# Patient Record
Sex: Female | Born: 1940 | Race: White | Hispanic: No | Smoking: Current every day smoker
Health system: Southern US, Community
[De-identification: ages and names within clinical notes are randomized; demographics above are authoritative.]

## PROBLEM LIST (undated history)

## (undated) DIAGNOSIS — E785 Hyperlipidemia, unspecified: Secondary | ICD-10-CM

## (undated) DIAGNOSIS — F329 Major depressive disorder, single episode, unspecified: Secondary | ICD-10-CM

## (undated) DIAGNOSIS — L409 Psoriasis, unspecified: Secondary | ICD-10-CM

## (undated) DIAGNOSIS — E119 Type 2 diabetes mellitus without complications: Secondary | ICD-10-CM

## (undated) DIAGNOSIS — F32A Depression, unspecified: Secondary | ICD-10-CM

## (undated) HISTORY — PX: HEMORROIDECTOMY: SUR656

## (undated) HISTORY — PX: CAROTID ARTERY ANGIOPLASTY: SHX1300

## (undated) HISTORY — PX: ABDOMINAL HYSTERECTOMY: SHX81

---

## 2019-01-15 ENCOUNTER — Encounter (HOSPITAL_BASED_OUTPATIENT_CLINIC_OR_DEPARTMENT_OTHER): Payer: Self-pay

## 2019-01-15 ENCOUNTER — Emergency Department (HOSPITAL_BASED_OUTPATIENT_CLINIC_OR_DEPARTMENT_OTHER)
Admission: EM | Admit: 2019-01-15 | Discharge: 2019-01-15 | Disposition: A | Discharge status: Home / Self Care | Payer: Medicare Other | Attending: Emergency Medicine | Admitting: Emergency Medicine

## 2019-01-15 ENCOUNTER — Other Ambulatory Visit: Payer: Self-pay

## 2019-01-15 DIAGNOSIS — Z7984 Long term (current) use of oral hypoglycemic drugs: Secondary | ICD-10-CM | POA: Insufficient documentation

## 2019-01-15 DIAGNOSIS — Y999 Unspecified external cause status: Secondary | ICD-10-CM | POA: Insufficient documentation

## 2019-01-15 DIAGNOSIS — Y929 Unspecified place or not applicable: Secondary | ICD-10-CM | POA: Insufficient documentation

## 2019-01-15 DIAGNOSIS — Y9301 Activity, walking, marching and hiking: Secondary | ICD-10-CM | POA: Insufficient documentation

## 2019-01-15 DIAGNOSIS — T148XXA Other injury of unspecified body region, initial encounter: Secondary | ICD-10-CM

## 2019-01-15 DIAGNOSIS — W19XXXA Unspecified fall, initial encounter: Secondary | ICD-10-CM

## 2019-01-15 DIAGNOSIS — Z791 Long term (current) use of non-steroidal anti-inflammatories (NSAID): Secondary | ICD-10-CM | POA: Diagnosis not present

## 2019-01-15 DIAGNOSIS — F17210 Nicotine dependence, cigarettes, uncomplicated: Secondary | ICD-10-CM | POA: Diagnosis not present

## 2019-01-15 DIAGNOSIS — E119 Type 2 diabetes mellitus without complications: Secondary | ICD-10-CM | POA: Diagnosis not present

## 2019-01-15 DIAGNOSIS — Z79899 Other long term (current) drug therapy: Secondary | ICD-10-CM | POA: Diagnosis not present

## 2019-01-15 DIAGNOSIS — W109XXA Fall (on) (from) unspecified stairs and steps, initial encounter: Secondary | ICD-10-CM | POA: Insufficient documentation

## 2019-01-15 DIAGNOSIS — S51812A Laceration without foreign body of left forearm, initial encounter: Secondary | ICD-10-CM | POA: Diagnosis not present

## 2019-01-15 DIAGNOSIS — Z23 Encounter for immunization: Secondary | ICD-10-CM | POA: Diagnosis not present

## 2019-01-15 DIAGNOSIS — S59912A Unspecified injury of left forearm, initial encounter: Secondary | ICD-10-CM | POA: Diagnosis present

## 2019-01-15 HISTORY — DX: Hyperlipidemia, unspecified: E78.5

## 2019-01-15 HISTORY — DX: Psoriasis, unspecified: L40.9

## 2019-01-15 HISTORY — DX: Depression, unspecified: F32.A

## 2019-01-15 HISTORY — DX: Type 2 diabetes mellitus without complications: E11.9

## 2019-01-15 HISTORY — DX: Major depressive disorder, single episode, unspecified: F32.9

## 2019-01-15 MED ORDER — TETANUS-DIPHTH-ACELL PERTUSSIS 5-2.5-18.5 LF-MCG/0.5 IM SUSP
0.5000 mL | Freq: Once | INTRAMUSCULAR | Status: AC
  Administered 2019-01-15: 0.5 mL via INTRAMUSCULAR
  Filled 2019-01-15: qty 0.5

## 2019-01-15 MED ORDER — PREDNISONE 20 MG PO TABS: 40.0000 mg | ORAL_TABLET | Freq: Every day | ORAL | 0 refills | Status: DC

## 2019-01-15 NOTE — ED Provider Notes (Signed)
[facility] EMERGENCY DEPARTMENT Provider Note   CSN: [id number] Arrival date & time: 01/15/19  1850    History   Chief Complaint Chief Complaint  Patient presents with  . Fall    HPI Jillian Webb is a 78 y.o. female.   HPI   77yF presenting after fall. Tripped going up steps. Didn't hit head. Denis acute headache, neck or back pain.  She has skin tears to her left forearm and left lower extremity.  Hematoma underneath the skin tears in her left forearm.  She is not anticoagulated.  She is unsure of her last tetanus shot.  Past Medical History:  Diagnosis Date  . Depression   . Diabetes mellitus without complication (HCC)   . Hyperlipidemia   . Psoriasis     There are no active problems to display for this patient.   Past Surgical History:  Procedure Laterality Date  . ABDOMINAL HYSTERECTOMY    . CAROTID ARTERY ANGIOPLASTY    . HEMORROIDECTOMY       OB History   No obstetric history on file.      Home Medications    Prior to Admission medications   Medication Sig Start Date End Date Taking? Authorizing Provider  gabapentin (NEURONTIN) 100 MG capsule TAKE 3 CAPSULES BY MOUTH EVERY NIGHT AT BEDTIME 05/18/18  Yes [provider]  glipiZIDE (GLUCOTROL) 5 MG tablet Take by mouth. 01/03/19  Yes [provider]  metFORMIN (GLUCOPHAGE) 1000 MG tablet Take 1,000 mg by mouth 2 (two) times daily with a meal.   Yes [provider]  meloxicam (MOBIC) 7.5 MG tablet Take by mouth.    [provider]  pravastatin (PRAVACHOL) 20 MG tablet Take by mouth.    [provider]  predniSONE (DELTASONE) 20 MG tablet Take 2 tablets (40 mg total) by mouth daily. 01/15/19   Raeford Razor, MD  sertraline (ZOLOFT) 25 MG tablet Take by mouth.    [provider]  Vitamin D, Ergocalciferol, (DRISDOL) 1.25 MG (50000 UT) CAPS capsule Take by mouth.    [provider]    Family History No family history on file.   Social History Social History   Tobacco Use  . Smoking status: Current Every Day Smoker    Types: Cigarettes  . Smokeless tobacco: Never Used  Substance Use Topics  . Alcohol use: Never    Frequency: Never  . Drug use: Never    Allergies   Sulfa antibiotics and Losartan   Review of Systems Review of Systems  All systems reviewed and negative, other than as noted in HPI.  Physical Exam Updated Vital Signs BP (!) 174/86 (BP Location: Right Arm)   Pulse 95   Temp 98.3 F (36.8 C) (Oral)   Resp 16   Ht 5' (1.524 m)   Wt 39.5 kg   SpO2 98%   BMI 16.99 kg/m   Physical Exam Vitals signs and nursing note reviewed.  Constitutional:      General: She is not in acute distress.    Appearance: She is well-developed.  HENT:     Head: Normocephalic and atraumatic.  Eyes:     General:        Right eye: No discharge.        Left eye: No discharge.     Conjunctiva/sclera: Conjunctivae normal.  Neck:     Musculoskeletal: Neck supple.  Cardiovascular:     Rate and Rhythm: Normal rate and regular rhythm.     Heart sounds:  Normal heart sounds. No murmur. No friction rub. No gallop.   Pulmonary:     Effort: Pulmonary effort is normal. No respiratory distress.     Breath sounds: Normal breath sounds.  Abdominal:     General: There is no distension.     Palpations: Abdomen is soft.     Tenderness: There is no abdominal tenderness.  Musculoskeletal:        General: No tenderness.     Comments: Skin tears to mid L forearm and L shin. Large hematoma underlying skin tear on forearm. No significant bony tenderness. No midline spinal tenderness. Nvi.   Skin:    General: Skin is warm and dry.  Neurological:     Mental Status: She is alert.  Psychiatric:        Behavior: Behavior normal.        Thought Content: Thought content normal.      ED Treatments / Results  Labs (all labs ordered are listed, but only abnormal results are displayed) Labs Reviewed - No data to display   EKG None  Radiology No results found.  Procedures Procedures (including critical care time)  LACERATION REPAIR Performed by: Raeford RazorStephen Jeneen Doutt Authorized by: Raeford RazorStephen Blessing Ozga Consent: Verbal consent obtained. Risks and benefits: risks, benefits and alternatives were discussed Consent given by: patient Patient identity confirmed: provided demographic data Prepped and Draped in normal sterile fashion Wound explored  Laceration Location: L forearm  Laceration Length: 3 cm  No Foreign Bodies seen or palpated  Anesthesia: local infiltration  Local anesthetic: none  Anesthetic total: n/a Irrigation method: syringe Amount of cleaning: standard  Skin closure: steri strips  Patient tolerance: Patient tolerated the procedure well with no immediate complications.  LACERATION REPAIR Performed by: Raeford RazorStephen Alexiss Iturralde Authorized by: Raeford RazorStephen Reveca Desmarais Consent: Verbal consent obtained. Risks and benefits: risks, benefits and alternatives were discussed Consent given by: patient Patient identity confirmed: provided demographic data Prepped and Draped in normal sterile fashion Wound explored  Laceration Location: L shin  Laceration Length: 4 cm  No Foreign Bodies seen or palpated  Anesthesia: local infiltration  Local anesthetic: none  Anesthetic total: n/a Irrigation method: syringe Amount of cleaning: standard  Skin closure: steri strips  Patient tolerance: Medications Ordered in ED Medications  Tdap (BOOSTRIX) injection 0.5 mL (0.5 mLs Intramuscular Given 01/15/19 1926)     Initial Impression / Assessment and Plan / ED Course  I have reviewed the triage vital signs and the nursing notes.  Pertinent labs & imaging results that were available during my care of the patient were reviewed by me and considered in my medical decision making (see chart for details).        77yF with several skin tears after mechanical fall. I doubt fracture and imaging deferred. Tetanus  updated. Pt additionally requesting steroids. 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 She says she has been prescribed them intermittently in the past and responds. Her feet look bad. They are raw in multiple places. I didn't have problem providing a prescription. Continued wound care was discussed. She was provided some additional dressing supplies and I explained how to dress the wound on her forearm.   Final Clinical Impressions(s) / ED Diagnoses   Final diagnoses:  Fall, initial encounter  Multiple skin tears    ED Discharge Orders         Ordered    predniSONE (DELTASONE) 20 MG tablet  Daily     01/15/19 1959  Raeford Razor, MD 01/17/19 [id number]

## 2019-01-15 NOTE — ED Notes (Signed)
ED Provider at bedside. 

## 2019-01-15 NOTE — ED Triage Notes (Signed)
Pt tripped going up stairs. Pt c/o skin tears to L arm and L ankle. Pt denies hitting her head. No LOC.

## 2019-10-05 ENCOUNTER — Ambulatory Visit: Payer: Medicare Other | Attending: Internal Medicine

## 2019-10-05 DIAGNOSIS — Z23 Encounter for immunization: Secondary | ICD-10-CM

## 2019-10-05 NOTE — Progress Notes (Signed)
   Covid-19 Vaccination Clinic  Name:  Jillian Webb    MRN: [id number] DOB: 02-18-1941  10/05/2019  [name] was observed post Covid-19 immunization for 15 minutes without incidence. She was provided with Vaccine Information Sheet and instruction to access the V-Safe system.   [name] was instructed to call 911 with any severe reactions post vaccine: Marland Kitchen Difficulty breathing  . Swelling of your face and throat  . A fast heartbeat  . A bad rash all over your body  . Dizziness and weakness    Immunizations Administered    Name Date Dose VIS Date Route   Pfizer COVID-19 Vaccine 10/05/2019  1:38 PM 0.3 mL 09/06/2019 Intramuscular   Manufacturer: [employer], [employer]   Lot: [id number]   NDC: [address]-9

## 2019-10-26 ENCOUNTER — Ambulatory Visit: Payer: Medicare Other | Attending: Internal Medicine

## 2019-10-26 DIAGNOSIS — Z23 Encounter for immunization: Secondary | ICD-10-CM | POA: Insufficient documentation

## 2019-10-26 NOTE — Progress Notes (Signed)
   Covid-19 Vaccination Clinic  Name:  [name]    MRN: [id number] DOB: 19-Jul-1941  10/26/2019  [name] was observed post Covid-19 immunization for 15 minutes without incidence. She was provided with Vaccine Information Sheet and instruction to access the V-Safe system.   [name] was instructed to call 911 with any severe reactions post vaccine: Marland Kitchen Difficulty breathing  . Swelling of your face and throat  . A fast heartbeat  . A bad rash all over your body  . Dizziness and weakness    Immunizations Administered    Name Date Dose VIS Date Route   Pfizer COVID-19 Vaccine 10/26/2019 12:08 PM 0.3 mL 09/06/2019 Intramuscular   Manufacturer: [employer], [employer]   Lot: [id number]   NDC: [address]-0

## 2020-05-20 ENCOUNTER — Other Ambulatory Visit: Payer: Self-pay

## 2020-05-20 ENCOUNTER — Encounter (HOSPITAL_BASED_OUTPATIENT_CLINIC_OR_DEPARTMENT_OTHER): Payer: Self-pay | Admitting: Emergency Medicine

## 2020-05-20 ENCOUNTER — Inpatient Hospital Stay (HOSPITAL_BASED_OUTPATIENT_CLINIC_OR_DEPARTMENT_OTHER)
Admission: EM | Admit: 2020-05-20 | Discharge: 2020-05-25 | DRG: 286 | Disposition: A | Discharge status: Hospice | Payer: Medicare (Managed Care) | Attending: Internal Medicine | Admitting: Internal Medicine

## 2020-05-20 ENCOUNTER — Emergency Department (HOSPITAL_BASED_OUTPATIENT_CLINIC_OR_DEPARTMENT_OTHER): Payer: Medicare (Managed Care)

## 2020-05-20 DIAGNOSIS — R570 Cardiogenic shock: Secondary | ICD-10-CM | POA: Diagnosis present

## 2020-05-20 DIAGNOSIS — R64 Cachexia: Secondary | ICD-10-CM | POA: Diagnosis present

## 2020-05-20 DIAGNOSIS — E872 Acidosis, unspecified: Secondary | ICD-10-CM

## 2020-05-20 DIAGNOSIS — I10 Essential (primary) hypertension: Secondary | ICD-10-CM

## 2020-05-20 DIAGNOSIS — E1169 Type 2 diabetes mellitus with other specified complication: Secondary | ICD-10-CM

## 2020-05-20 DIAGNOSIS — I2510 Atherosclerotic heart disease of native coronary artery without angina pectoris: Secondary | ICD-10-CM | POA: Diagnosis present

## 2020-05-20 DIAGNOSIS — R778 Other specified abnormalities of plasma proteins: Secondary | ICD-10-CM

## 2020-05-20 DIAGNOSIS — I081 Rheumatic disorders of both mitral and tricuspid valves: Secondary | ICD-10-CM | POA: Diagnosis present

## 2020-05-20 DIAGNOSIS — E785 Hyperlipidemia, unspecified: Secondary | ICD-10-CM | POA: Diagnosis not present

## 2020-05-20 DIAGNOSIS — I5082 Biventricular heart failure: Secondary | ICD-10-CM | POA: Diagnosis present

## 2020-05-20 DIAGNOSIS — F05 Delirium due to known physiological condition: Secondary | ICD-10-CM | POA: Diagnosis not present

## 2020-05-20 DIAGNOSIS — R54 Age-related physical debility: Secondary | ICD-10-CM | POA: Diagnosis present

## 2020-05-20 DIAGNOSIS — E119 Type 2 diabetes mellitus without complications: Secondary | ICD-10-CM | POA: Diagnosis present

## 2020-05-20 DIAGNOSIS — Z20822 Contact with and (suspected) exposure to covid-19: Secondary | ICD-10-CM | POA: Diagnosis present

## 2020-05-20 DIAGNOSIS — K551 Chronic vascular disorders of intestine: Secondary | ICD-10-CM

## 2020-05-20 DIAGNOSIS — I509 Heart failure, unspecified: Secondary | ICD-10-CM

## 2020-05-20 DIAGNOSIS — I255 Ischemic cardiomyopathy: Secondary | ICD-10-CM | POA: Diagnosis not present

## 2020-05-20 DIAGNOSIS — F17210 Nicotine dependence, cigarettes, uncomplicated: Secondary | ICD-10-CM | POA: Diagnosis present

## 2020-05-20 DIAGNOSIS — E8770 Fluid overload, unspecified: Secondary | ICD-10-CM | POA: Diagnosis present

## 2020-05-20 DIAGNOSIS — F329 Major depressive disorder, single episode, unspecified: Secondary | ICD-10-CM | POA: Diagnosis present

## 2020-05-20 DIAGNOSIS — J449 Chronic obstructive pulmonary disease, unspecified: Secondary | ICD-10-CM | POA: Diagnosis present

## 2020-05-20 DIAGNOSIS — Z7952 Long term (current) use of systemic steroids: Secondary | ICD-10-CM

## 2020-05-20 DIAGNOSIS — G9341 Metabolic encephalopathy: Secondary | ICD-10-CM | POA: Diagnosis present

## 2020-05-20 DIAGNOSIS — I700 Atherosclerosis of aorta: Secondary | ICD-10-CM | POA: Diagnosis present

## 2020-05-20 DIAGNOSIS — I2584 Coronary atherosclerosis due to calcified coronary lesion: Secondary | ICD-10-CM | POA: Diagnosis present

## 2020-05-20 DIAGNOSIS — R627 Adult failure to thrive: Secondary | ICD-10-CM | POA: Diagnosis present

## 2020-05-20 DIAGNOSIS — Z882 Allergy status to sulfonamides status: Secondary | ICD-10-CM

## 2020-05-20 DIAGNOSIS — E43 Unspecified severe protein-calorie malnutrition: Secondary | ICD-10-CM | POA: Diagnosis present

## 2020-05-20 DIAGNOSIS — F411 Generalized anxiety disorder: Secondary | ICD-10-CM | POA: Diagnosis present

## 2020-05-20 DIAGNOSIS — I5021 Acute systolic (congestive) heart failure: Secondary | ICD-10-CM | POA: Diagnosis not present

## 2020-05-20 DIAGNOSIS — Z681 Body mass index (BMI) 19 or less, adult: Secondary | ICD-10-CM

## 2020-05-20 DIAGNOSIS — Z7984 Long term (current) use of oral hypoglycemic drugs: Secondary | ICD-10-CM

## 2020-05-20 DIAGNOSIS — I248 Other forms of acute ischemic heart disease: Secondary | ICD-10-CM | POA: Diagnosis present

## 2020-05-20 DIAGNOSIS — E876 Hypokalemia: Secondary | ICD-10-CM | POA: Diagnosis present

## 2020-05-20 DIAGNOSIS — Z888 Allergy status to other drugs, medicaments and biological substances status: Secondary | ICD-10-CM

## 2020-05-20 DIAGNOSIS — I110 Hypertensive heart disease with heart failure: Principal | ICD-10-CM | POA: Diagnosis present

## 2020-05-20 DIAGNOSIS — E279 Disorder of adrenal gland, unspecified: Secondary | ICD-10-CM

## 2020-05-20 DIAGNOSIS — Z79899 Other long term (current) drug therapy: Secondary | ICD-10-CM

## 2020-05-20 DIAGNOSIS — I5041 Acute combined systolic (congestive) and diastolic (congestive) heart failure: Secondary | ICD-10-CM | POA: Diagnosis present

## 2020-05-20 DIAGNOSIS — D649 Anemia, unspecified: Secondary | ICD-10-CM | POA: Diagnosis present

## 2020-05-20 DIAGNOSIS — E8779 Other fluid overload: Secondary | ICD-10-CM | POA: Diagnosis not present

## 2020-05-20 DIAGNOSIS — F0390 Unspecified dementia without behavioral disturbance: Secondary | ICD-10-CM | POA: Diagnosis present

## 2020-05-20 DIAGNOSIS — I472 Ventricular tachycardia: Secondary | ICD-10-CM | POA: Diagnosis not present

## 2020-05-20 DIAGNOSIS — Z66 Do not resuscitate: Secondary | ICD-10-CM | POA: Diagnosis present

## 2020-05-20 DIAGNOSIS — L409 Psoriasis, unspecified: Secondary | ICD-10-CM | POA: Diagnosis present

## 2020-05-20 DIAGNOSIS — I5023 Acute on chronic systolic (congestive) heart failure: Secondary | ICD-10-CM | POA: Diagnosis not present

## 2020-05-20 DIAGNOSIS — Z90710 Acquired absence of both cervix and uterus: Secondary | ICD-10-CM

## 2020-05-20 DIAGNOSIS — Z716 Tobacco abuse counseling: Secondary | ICD-10-CM

## 2020-05-20 LAB — URINALYSIS, MICROSCOPIC (REFLEX)

## 2020-05-20 LAB — BRAIN NATRIURETIC PEPTIDE: B Natriuretic Peptide: 3975.8 pg/mL — ABNORMAL HIGH (ref 0.0–100.0)

## 2020-05-20 LAB — CBC WITH DIFFERENTIAL/PLATELET
Abs Immature Granulocytes: 0.03 10*3/uL (ref 0.00–0.07)
Basophils Absolute: 0.1 10*3/uL (ref 0.0–0.1)
Basophils Relative: 1 %
Eosinophils Absolute: 0 10*3/uL (ref 0.0–0.5)
Eosinophils Relative: 0 %
HCT: 37.7 % (ref 36.0–46.0)
Hemoglobin: 11.1 g/dL — ABNORMAL LOW (ref 12.0–15.0)
Immature Granulocytes: 1 %
Lymphocytes Relative: 11 %
Lymphs Abs: 0.8 10*3/uL (ref 0.7–4.0)
MCH: 23.9 pg — ABNORMAL LOW (ref 26.0–34.0)
MCHC: 29.4 g/dL — ABNORMAL LOW (ref 30.0–36.0)
MCV: 81.1 fL (ref 80.0–100.0)
Monocytes Absolute: 0.4 10*3/uL (ref 0.1–1.0)
Monocytes Relative: 5 %
Neutro Abs: 5.4 10*3/uL (ref 1.7–7.7)
Neutrophils Relative %: 82 %
Platelets: 367 10*3/uL (ref 150–400)
RBC: 4.65 MIL/uL (ref 3.87–5.11)
RDW: 18.6 % — ABNORMAL HIGH (ref 11.5–15.5)
WBC: 6.6 10*3/uL (ref 4.0–10.5)
nRBC: 0 % (ref 0.0–0.2)

## 2020-05-20 LAB — COMPREHENSIVE METABOLIC PANEL
ALT: 16 U/L (ref 0–44)
AST: 20 U/L (ref 15–41)
Albumin: 3.6 g/dL (ref 3.5–5.0)
Alkaline Phosphatase: 97 U/L (ref 38–126)
Anion gap: 13 (ref 5–15)
BUN: 16 mg/dL (ref 8–23)
CO2: 23 mmol/L (ref 22–32)
Calcium: 8.6 mg/dL — ABNORMAL LOW (ref 8.9–10.3)
Chloride: 99 mmol/L (ref 98–111)
Creatinine, Ser: 0.86 mg/dL (ref 0.44–1.00)
GFR calc Af Amer: >60 mL/min (ref >60)
GFR calc non Af Amer: >60 mL/min (ref >60)
Glucose, Bld: 283 mg/dL — ABNORMAL HIGH (ref 70–99)
Potassium: 3.3 mmol/L — ABNORMAL LOW (ref 3.5–5.1)
Sodium: 135 mmol/L (ref 135–145)
Total Bilirubin: 1.1 mg/dL (ref 0.3–1.2)
Total Protein: 6.8 g/dL (ref 6.5–8.1)

## 2020-05-20 LAB — URINALYSIS, ROUTINE W REFLEX MICROSCOPIC
Bilirubin Urine: NEGATIVE
Glucose, UA: NEGATIVE mg/dL
Hgb urine dipstick: NEGATIVE
Ketones, ur: NEGATIVE mg/dL
Leukocytes,Ua: NEGATIVE
Nitrite: NEGATIVE
Protein, ur: 100 mg/dL — AB
Specific Gravity, Urine: 1.02 (ref 1.005–1.030)
pH: 5.5 (ref 5.0–8.0)

## 2020-05-20 LAB — LACTIC ACID, PLASMA
Lactic Acid, Venous: 2.4 mmol/L (ref 0.5–1.9)
Lactic Acid, Venous: 2.6 mmol/L (ref 0.5–1.9)

## 2020-05-20 LAB — TROPONIN I (HIGH SENSITIVITY)
Troponin I (High Sensitivity): 33 ng/L — ABNORMAL HIGH (ref <18)
Troponin I (High Sensitivity): 45 ng/L — ABNORMAL HIGH (ref <18)

## 2020-05-20 LAB — SARS CORONAVIRUS 2 BY RT PCR (HOSPITAL ORDER, PERFORMED IN ~~LOC~~ HOSPITAL LAB): SARS Coronavirus 2: NEGATIVE

## 2020-05-20 LAB — LIPASE, BLOOD: Lipase: 24 U/L (ref 11–51)

## 2020-05-20 LAB — GLUCOSE, CAPILLARY: Glucose-Capillary: 228 mg/dL — ABNORMAL HIGH (ref 70–99)

## 2020-05-20 MED ORDER — ENOXAPARIN SODIUM 40 MG/0.4ML ~~LOC~~ SOLN
40.0000 mg | SUBCUTANEOUS | Status: DC
  Administered 2020-05-20 – 2020-05-21 (×2): 40 mg via SUBCUTANEOUS
  Filled 2020-05-20 (×2): qty 0.4

## 2020-05-20 MED ORDER — IOHEXOL 300 MG/ML  SOLN
100.0000 mL | Freq: Once | INTRAMUSCULAR | Status: AC | PRN
  Administered 2020-05-20: 100 mL via INTRAVENOUS

## 2020-05-20 MED ORDER — SODIUM CHLORIDE 0.9 % IV SOLN: 250.0000 mL | INTRAVENOUS | Status: DC | PRN

## 2020-05-20 MED ORDER — SODIUM CHLORIDE 0.9% FLUSH: 3.0000 mL | INTRAVENOUS | Status: DC | PRN

## 2020-05-20 MED ORDER — ONDANSETRON HCL 4 MG/2ML IJ SOLN: 4.0000 mg | Freq: Four times a day (QID) | INTRAMUSCULAR | Status: DC | PRN

## 2020-05-20 MED ORDER — INSULIN ASPART 100 UNIT/ML ~~LOC~~ SOLN
0.0000 [IU] | Freq: Three times a day (TID) | SUBCUTANEOUS | Status: DC
  Administered 2020-05-21 (×3): 2 [IU] via SUBCUTANEOUS
  Administered 2020-05-22: 3 [IU] via SUBCUTANEOUS
  Administered 2020-05-22: 5 [IU] via SUBCUTANEOUS
  Administered 2020-05-23: 3 [IU] via SUBCUTANEOUS
  Administered 2020-05-23: 7 [IU] via SUBCUTANEOUS
  Administered 2020-05-23: 1 [IU] via SUBCUTANEOUS
  Administered 2020-05-24: 2 [IU] via SUBCUTANEOUS
  Administered 2020-05-24: 5 [IU] via SUBCUTANEOUS
  Administered 2020-05-25: 2 [IU] via SUBCUTANEOUS

## 2020-05-20 MED ORDER — SODIUM CHLORIDE 0.9% FLUSH
3.0000 mL | Freq: Two times a day (BID) | INTRAVENOUS | Status: DC
  Administered 2020-05-20 – 2020-05-21 (×3): 3 mL via INTRAVENOUS

## 2020-05-20 MED ORDER — INSULIN ASPART 100 UNIT/ML ~~LOC~~ SOLN
0.0000 [IU] | Freq: Every day | SUBCUTANEOUS | Status: DC
  Administered 2020-05-20: 2 [IU] via SUBCUTANEOUS
  Administered 2020-05-22: 3 [IU] via SUBCUTANEOUS
  Administered 2020-05-23 – 2020-05-24 (×2): 2 [IU] via SUBCUTANEOUS

## 2020-05-20 MED ORDER — FUROSEMIDE 10 MG/ML IJ SOLN
20.0000 mg | Freq: Every day | INTRAMUSCULAR | Status: DC
  Filled 2020-05-20: qty 2

## 2020-05-20 MED ORDER — POTASSIUM CHLORIDE 10 MEQ/100ML IV SOLN
10.0000 meq | INTRAVENOUS | Status: AC
  Administered 2020-05-20 (×2): 10 meq via INTRAVENOUS
  Filled 2020-05-20 (×2): qty 100

## 2020-05-20 MED ORDER — FUROSEMIDE 10 MG/ML IJ SOLN
20.0000 mg | Freq: Once | INTRAMUSCULAR | Status: AC
  Administered 2020-05-20: 20 mg via INTRAVENOUS
  Filled 2020-05-20: qty 2

## 2020-05-20 MED ORDER — ACETAMINOPHEN 325 MG PO TABS: 650.0000 mg | ORAL_TABLET | ORAL | Status: DC | PRN

## 2020-05-20 NOTE — ED Notes (Signed)
Pt reports several pmd visits over past 2 weeks, recent tx pneumonia. Tested negative for covid 2 weeks ago.  Pt answers all questions appropriately, denies pain, does report significant fatigue, poor appetite.

## 2020-05-20 NOTE — H&P (Signed)
History and Physical    Jillian Webb OEH:[id number] DOB: 1941-07-22 DOA: 05/20/2020  PCP: Cheron Schaumann., MD  Patient coming from: [facility] transfer  I have personally briefly reviewed patient's old medical records in [facility]  Chief Complaint: increasing LE edema  HPI: Jillian Webb is a 79 y.o. female with medical history significant for hypertension, type 2 diabetes, and hyperlipidemia who presents with concerns of increasing lower extremity edema.  She is being transfer from med [facility] ED to [facility] for concerns of new onset CHF.  Based on documentation, patient appears to be an inconsistent historian.  States that she has been having bilateral lower extremity edema since last week although she told ED physician that has been ongoing for months.  Also endorse some mild shortness of breath with exertion but she has anxiety attack at times is difficult to tell.  She denies any orthopnea or paroxysmal nocturnal dyspnea.  Denies any chest pain or palpitation.  No nausea, vomiting or diarrhea or abdominal pain.  Reports that she is compliant with her medications.  She is a current smoker and uses about 1 pack of tobacco per day.  Denies alcohol or illicit drug use. States family has history of mom and dad both needing heart surgery but unclear of the reason.    ED Course: She was afebrile, tachycardic and tachypneic although normotensive on room air. CT abdomen and pelvis significant for borderline cardiomegaly with small right greater than left pleural effusion.  Dental finding of severe stenosis of the proximal SMA due to arthrosclerotic plaque and an indeterminate 1.5 cm left adrenal lesion. BNP of 3000 175.  Initial troponin of 33.  Lactic acid of 2.4. CBC shows no leukocytosis, mild normocytic anemia of 11.1.  Mild hypokalemia of 3.3.  Glucose of 283.  Normal creatinine of 0.86.  Review of Systems:  Constitutional: No Weight Change, No Fever ENT/Mouth: No  sore throat, No Rhinorrhea Eyes: No Eye Pain, No Vision Changes Cardiovascular: No Chest Pain, + SOB, No PND, No Dyspnea on Exertion, No Orthopnea, No Claudication, + Edema, No Palpitations Respiratory: No Cough, No Sputum Gastrointestinal: No Nausea, No Vomiting, No Diarrhea, No Constipation, No Pain Genitourinary: no Urinary Incontinence Musculoskeletal: No Arthralgias, No Myalgias Skin: No Skin Lesions, No Pruritus, Neuro: no Weakness, No Numbness Psych: No Anxiety/Panic, No Depression, no decrease appetite Heme/Lymph: No Bruising, No Bleeding   Past Medical History:  Diagnosis Date  . Depression   . Diabetes mellitus without complication (HCC)   . Hyperlipidemia   . Psoriasis     Past Surgical History:  Procedure Laterality Date  . ABDOMINAL HYSTERECTOMY    . CAROTID ARTERY ANGIOPLASTY    . HEMORROIDECTOMY       reports that she has been smoking cigarettes. She has never used smokeless tobacco. She reports that she does not drink alcohol and does not use drugs. Social History  Allergies  Allergen Reactions  . Sulfa Antibiotics Itching  . Losartan Rash    erythroderma    Family Hx  Mother required heart surgery for unknown reason  Prior to Admission medications   Medication Sig Start Date End Date Taking? Authorizing Provider  gabapentin (NEURONTIN) 100 MG capsule TAKE 3 CAPSULES BY MOUTH EVERY NIGHT AT BEDTIME 05/18/18   [provider]  glipiZIDE (GLUCOTROL) 5 MG tablet Take by mouth. 01/03/19   [provider]  meloxicam (MOBIC) 7.5 MG tablet Take by mouth.    [provider]  metFORMIN (GLUCOPHAGE) 1000 MG  tablet Take 1,000 mg by mouth 2 (two) times daily with a meal.    [provider]  pravastatin (PRAVACHOL) 20 MG tablet Take by mouth.    [provider]  predniSONE (DELTASONE) 20 MG tablet Take 2 tablets (40 mg total) by mouth daily. 01/15/19   Raeford RazorKohut, Stephen, MD  sertraline (ZOLOFT) 25 MG tablet Take by mouth.     [provider]  Vitamin D, Ergocalciferol, (DRISDOL) 1.25 MG (50000 UT) CAPS capsule Take by mouth.    [provider]    Physical Exam: Vitals:   05/20/20 1730 05/20/20 1900 05/20/20 1925 05/20/20 2025  BP: 101/85 105/82 120/70 128/62  Pulse: (!) 101 98 100 100  Resp: 16   20  Temp:    98.4 F (36.9 C)  SpO2: 97% 99% 100% 99%    Constitutional: NAD, calm, comfortable, thin elderly female laying at 30 degree incline in bed Vitals:   05/20/20 1730 05/20/20 1900 05/20/20 1925 05/20/20 2025  BP: 101/85 105/82 120/70 128/62  Pulse: (!) 101 98 100 100  Resp: 16   20  Temp:    98.4 F (36.9 C)  SpO2: 97% 99% 100% 99%   Eyes: PERRL, lids and conjunctivae normal ENMT: Mucous membranes are moist.  Neck: normal, supple Respiratory: clear to auscultation bilaterally, no wheezing, no crackles. Normal respiratory effort on room air. No accessory muscle use.  Cardiovascular: Regular rate and rhythm, no murmurs / rubs / gallops.  +3 pitting edema up to knees of bilateral lower extremity.   Abdomen: no tenderness, no masses palpated.Bowel sounds positive.  Musculoskeletal: no clubbing / cyanosis. No joint deformity upper and lower extremities. Good ROM, no contractures. Normal muscle tone.  Skin: no rashes, lesions, ulcers. No induration Neurologic: CN 2-12 grossly intact. Sensation intact, Strength 5/5 in all 4.  Psychiatric: Normal judgment and insight. Alert and oriented x 3. Normal mood.   Labs on Admission: I have personally reviewed following labs and imaging studies  CBC: Recent Labs  Lab 05/20/20 1036  WBC 6.6  NEUTROABS 5.4  HGB 11.1*  HCT 37.7  MCV 81.1  PLT 367   Basic Metabolic Panel: Recent Labs  Lab 05/20/20 1036  NA 135  K 3.3*  CL 99  CO2 23  GLUCOSE 283*  BUN 16  CREATININE 0.86  CALCIUM 8.6*   GFR: CrCl cannot be calculated (Unknown ideal weight.). Liver Function Tests: Recent Labs  Lab 05/20/20 1036  AST 20  ALT 16  ALKPHOS  97  BILITOT 1.1  PROT 6.8  ALBUMIN 3.6   Recent Labs  Lab 05/20/20 1036  LIPASE 24   No results for input(s): AMMONIA in the last 168 hours. Coagulation Profile: No results for input(s): INR, PROTIME in the last 168 hours. Cardiac Enzymes: No results for input(s): CKTOTAL, CKMB, CKMBINDEX, TROPONINI in the last 168 hours. BNP (last 3 results) No results for input(s): PROBNP in the last 8760 hours. HbA1C: No results for input(s): HGBA1C in the last 72 hours. CBG: Recent Labs  Lab 05/20/20 2231  GLUCAP 228*   Lipid Profile: No results for input(s): CHOL, HDL, LDLCALC, TRIG, CHOLHDL, LDLDIRECT in the last 72 hours. Thyroid Function Tests: No results for input(s): TSH, T4TOTAL, FREET4, T3FREE, THYROIDAB in the last 72 hours. Anemia Panel: No results for input(s): VITAMINB12, FOLATE, FERRITIN, TIBC, IRON, RETICCTPCT in the last 72 hours. Urine analysis:    Component Value Date/Time   COLORURINE YELLOW 05/20/2020 1345   APPEARANCEUR HAZY (A) 05/20/2020 1345  LABSPEC 1.020 05/20/2020 1345   PHURINE 5.5 05/20/2020 1345   GLUCOSEU NEGATIVE 05/20/2020 1345   HGBUR NEGATIVE 05/20/2020 1345   BILIRUBINUR NEGATIVE 05/20/2020 1345   KETONESUR NEGATIVE 05/20/2020 1345   PROTEINUR 100 (A) 05/20/2020 1345   NITRITE NEGATIVE 05/20/2020 1345   LEUKOCYTESUR NEGATIVE 05/20/2020 1345    Radiological Exams on Admission: DG Chest 2 View  Result Date: 05/20/2020 CLINICAL DATA:  Fatigue with lower extremity edema. EXAM: CHEST - 2 VIEW COMPARISON:  May 04, 2020 FINDINGS: There is underlying interstitial thickening, likely fibrotic change. There is no appreciable edema or consolidation. The heart is upper normal in size with pulmonary vascularity normal. Central peribronchial thickening noted. There is aortic atherosclerosis. No adenopathy. There are calcified breast implants bilaterally. Bones appear osteoporotic. There are surgical clips in the right neck region. There is calcification in  each axillary artery. IMPRESSION: Suspected underlying interstitial fibrosis. No edema or airspace opacity. Heart upper normal in size. No adenopathy appreciable. Calcified breast implants. Aortic Atherosclerosis (ICD10-I70.0). Axillary artery calcification bilaterally also noted. Bones appear osteoporotic. Electronically Signed   By: Bretta Bang III M.D.   On: 05/20/2020 11:45   CT Head Wo Contrast  Result Date: 05/20/2020 CLINICAL DATA:  Delirium/altered mental status EXAM: CT HEAD WITHOUT CONTRAST TECHNIQUE: Contiguous axial images were obtained from the base of the skull through the vertex without intravenous contrast. COMPARISON:  None. FINDINGS: Brain: There is moderate diffuse atrophy. There is no intracranial mass, hemorrhage extra-axial fluid collection, or midline shift. There is patchy small vessel disease in the centra semiovale bilaterally. Small vessel disease is noted in the posterior limb of each internal capsule. No acute appearing infarct evident. Vascular: No hyperdense vessel. There is calcification in each carotid siphon and distal left vertebral artery region. Skull: The bony calvarium appears intact. Sinuses/Orbits: Visualized paranasal sinuses are clear. There is apparent cataract removal on the left. Orbits otherwise appear symmetric bilaterally. Other: Mastoid air cells are clear. IMPRESSION: Atrophy with patchy supratentorial small vessel disease. No acute infarct. No mass or hemorrhage. There are foci of arterial vascular calcification. Electronically Signed   By: Bretta Bang III M.D.   On: 05/20/2020 11:42   CT ABDOMEN PELVIS W CONTRAST  Result Date: 05/20/2020 CLINICAL DATA:  Fatigue, leg swelling, difficulty eating and drinking the past 2 months. EXAM: CT ABDOMEN AND PELVIS WITH CONTRAST TECHNIQUE: Multidetector CT imaging of the abdomen and pelvis was performed using the standard protocol following bolus administration of intravenous contrast. CONTRAST:   OMNIPAQUE IOHEXOL 300 MG/ML  SOLN COMPARISON:  Chest x-ray from same day. FINDINGS: Lower chest: Borderline cardiomegaly with enlarged right atrium. Small right greater than left pleural effusions. Centrilobular emphysema. Hepatobiliary: Subcentimeter low-density lesion in the right hepatic lobe is too small to characterize. No other focal liver abnormality. Somewhat contracted gallbladder with diffuse wall thickening. No gallstones or biliary dilatation. Pancreas: Unremarkable. No pancreatic ductal dilatation or surrounding inflammatory changes. Spleen: Normal in size without focal abnormality. Adrenals/Urinary Tract: 1.5 cm indeterminate lesion in the left adrenal gland. The right adrenal gland is unremarkable. No renal calculi, solid lesion, or hydronephrosis. The bladder is underdistended. Stomach/Bowel: Stomach is within normal limits. Appendix appears normal. No evidence of bowel wall thickening, distention, or inflammatory changes. Extensive colonic diverticulosis. Vascular/Lymphatic: Extensive aortoiliac atherosclerosis. 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 No enlarged abdominal or pelvic lymph nodes. Reproductive: Fibroid uterus. Some fibroids are calcified, others are cystically degenerated. No adnexal mass. Other: Small ascites.  No pneumoperitoneum. Musculoskeletal:  No acute or significant osseous findings. Anasarca. IMPRESSION: 1. No acute intra-abdominal process. 2. Small right greater than left pleural effusions, small ascites, and anasarca, consistent with volume overload. 3. 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 4. Indeterminate 1.5 cm left adrenal lesion. Consider follow-up adrenal protocol CT with and without contrast in 12 months. This recommendation follows ACR consensus guidelines: Management of Incidental Adrenal Masses: A White Paper of the ACR Incidental Findings Committee. J Am Coll Radiol 2017;14:1038-1044. 5. Aortic  Atherosclerosis (ICD10-I70.0) and Emphysema (ICD10-J43.9). Electronically Signed   By: Obie Dredge M.D.   On: 05/20/2020 12:53      Assessment/Plan  LE edema secondary to new onset CHF Patient with BNP of greater than 3000 and CT abdomen pelvic evidence of cardiomegaly Strict I's and O's Check daily weights Obtain echocardiogram Check TSH Daily IV 20 mg Lasix  Elevated troponin Continue to trend.  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 Will check lipid panel and consider increasing to high intensity statin. Continue pravastatin for now Will need to consider follow-up outpatient with vascular surgery  Indeterminate left adrenal lesion Recommend follow-up adrenal protocol CT in 1 year  Type 2 diabetes Last hemoglobin A1c in May 2021 of 6.2.  Will repeat Placed on low sensitive sliding scale.   Hypertension Stable  Mild normocytic anemia Hemoglobin of 11.1.  Repeat CBC in the morning prior to obtaining further anemia work-up.  Tobacco use Cessation encouraged  DVT prophylaxis:.Lovenox Code Status: DNR- confirmed with patient Family Communication: Plan discussed with patient at bedside  disposition Plan: Home with at least 2 midnight stays  Consults called:  Admission status: inpatient  Status is: Inpatient  Remains inpatient appropriate because:Inpatient level of care appropriate due to severity of illness   Dispo: The patient is from: Home              Anticipated d/c is to: Home              Anticipated d/c date is: > 3 days              Patient currently is not medically stable to d/c.         Anselm Jungling DO [facility]   If 7PM-7AM, please contact night-coverage [email or web address]   05/20/2020, 10:34  PM

## 2020-05-20 NOTE — ED Notes (Signed)
Patient transported to CT 

## 2020-05-20 NOTE — ED Provider Notes (Signed)
[facility] EMERGENCY DEPARTMENT Provider Note   CSN: [id number] Arrival date & time: 05/20/20  1016     History Chief Complaint  Patient presents with  . Failure To Thrive  . Leg Swelling  . Altered Mental Status    Jillian Webb is a 79 y.o. female.  79 yo F with a chief complaints of lower extremity edema fatigue and decreased oral intake.  This been going on for a couple months now.  The patient states that all started when she felt like a pill got stuck in her throat.  She has been having some trouble tolerating food since then.  At some point she was drinking a soda and had a coughing fit and then was diagnosed with pneumonia afterwards.  She has had a very mild cough.  Feels that she is gotten worse over the past few days.  New lower extremity edema.  No history of heart failure.  Denies chest pain or pressure.  Denies fevers or chills.  Has been having some epigastric and right upper quadrant abdominal pain.  Usually worse with eating.  Denies history of cancer.  The history is provided by the patient.  Altered Mental Status Severity:  Moderate Most recent episode:  Yesterday Episode history:  Multiple Duration:  2 months Timing:  Intermittent Progression:  Waxing and waning Chronicity:  New Associated symptoms: abdominal pain   Associated symptoms: no fever, no headaches, no nausea, no palpitations and no vomiting        Past Medical History:  Diagnosis Date  . Depression   . Diabetes mellitus without complication (HCC)   . Hyperlipidemia   . Psoriasis     Patient Active Problem List   Diagnosis Date Noted  . Fluid overload 05/20/2020    Past Surgical History:  Procedure Laterality Date  . ABDOMINAL HYSTERECTOMY    . CAROTID ARTERY ANGIOPLASTY    . HEMORROIDECTOMY       OB History   No obstetric history on file.     History reviewed. No pertinent family history.  Social History   Tobacco Use  . Smoking status: Current Every Day  Smoker    Types: Cigarettes  . Smokeless tobacco: Never Used  Vaping Use  . Vaping Use: Never used  Substance Use Topics  . Alcohol use: Never  . Drug use: Never    Home Medications Prior to Admission medications   Medication Sig Start Date End Date Taking? Authorizing Provider  gabapentin (NEURONTIN) 100 MG capsule TAKE 3 CAPSULES BY MOUTH EVERY NIGHT AT BEDTIME 05/18/18   [provider]  glipiZIDE (GLUCOTROL) 5 MG tablet Take by mouth. 01/03/19   [provider]  meloxicam (MOBIC) 7.5 MG tablet Take by mouth.    [provider]  metFORMIN (GLUCOPHAGE) 1000 MG tablet Take 1,000 mg by mouth 2 (two) times daily with a meal.    [provider]  pravastatin (PRAVACHOL) 20 MG tablet Take by mouth.    [provider]  predniSONE (DELTASONE) 20 MG tablet Take 2 tablets (40 mg total) by mouth daily. 01/15/19   Raeford Razor, MD  sertraline (ZOLOFT) 25 MG tablet Take by mouth.    [provider]  Vitamin D, Ergocalciferol, (DRISDOL) 1.25 MG (50000 UT) CAPS capsule Take by mouth.    [provider]    Allergies    Sulfa antibiotics and Losartan  Review of Systems   Review of Systems  Constitutional: Positive for fatigue. Negative for chills and fever.  HENT: Positive for trouble swallowing. Negative for congestion and rhinorrhea.   Eyes: Negative for redness and visual disturbance.  Respiratory: Positive for cough. Negative for shortness of breath and wheezing.   Cardiovascular: Positive for leg swelling. Negative for chest pain and palpitations.  Gastrointestinal: Positive for abdominal pain. Negative for nausea and vomiting.  Genitourinary: Negative for dysuria and urgency.  Musculoskeletal: Negative for arthralgias and myalgias.  Skin: Negative for pallor and wound.  Neurological: Negative for dizziness and headaches.    Physical Exam Updated Vital Signs BP 115/65   Pulse (!) 103   Temp 97.9 F (36.6 C)   Resp (!) 23    SpO2 99%   Physical Exam Vitals and nursing note reviewed.  Constitutional:      General: She is not in acute distress.    Appearance: She is well-developed. She is not diaphoretic.     Comments: Cachectic  HENT:     Head: Normocephalic and atraumatic.  Eyes:     Pupils: Pupils are equal, round, and reactive to light.  Neck:     Vascular: JVD (up to the angle of the jaw) present.  Cardiovascular:     Rate and Rhythm: Regular rhythm. Tachycardia present.     Heart sounds: No murmur heard.  No friction rub. No gallop.   Pulmonary:     Effort: Pulmonary effort is normal.     Breath sounds: No wheezing or rales.  Abdominal:     General: There is no distension.     Palpations: Abdomen is soft.     Tenderness: There is no abdominal tenderness.  Musculoskeletal:        General: No tenderness.     Cervical back: Normal range of motion and neck supple.     Right lower leg: Edema present.     Left lower leg: Edema present.     Comments: 3+ up to the thighs  Skin:    General: Skin is warm and dry.  Neurological:     Mental Status: She is alert and oriented to person, place, and time.  Psychiatric:        Behavior: Behavior normal.     ED Results / Procedures / Treatments   Labs (all labs ordered are listed, but only abnormal results are displayed) Labs Reviewed  CBC WITH DIFFERENTIAL/PLATELET - Abnormal; Notable for the following components:      Result Value   Hemoglobin 11.1 (*)    MCH 23.9 (*)    MCHC 29.4 (*)    RDW 18.6 (*)    All other components within normal limits  COMPREHENSIVE METABOLIC PANEL - Abnormal; Notable for the following components:   Potassium 3.3 (*)    Glucose, Bld 283 (*)    Calcium 8.6 (*)    All other components within normal limits  BRAIN NATRIURETIC PEPTIDE - Abnormal; Notable for the following components:   B Natriuretic Peptide 3,975.8 (*)    All other components within normal limits  URINALYSIS, ROUTINE W REFLEX MICROSCOPIC - Abnormal;  Notable for the following components:   APPearance HAZY (*)    Protein, ur 100 (*)    All other components within normal limits  LACTIC ACID, PLASMA - Abnormal; Notable for the following components:   Lactic Acid, Venous 2.4 (*)    All other components within normal limits  LACTIC ACID, PLASMA - Abnormal; Notable for the following components:   Lactic Acid, Venous 2.6 (*)    All other components within normal limits  URINALYSIS, MICROSCOPIC (REFLEX) - Abnormal; Notable for the following components:   Bacteria, UA FEW (*)    All other components within normal limits  TROPONIN I (HIGH SENSITIVITY) - Abnormal; Notable for the following components:   Troponin I (High Sensitivity) 33 (*)    All other components within normal limits  SARS CORONAVIRUS 2 BY RT PCR (HOSPITAL ORDER, PERFORMED IN [facility])  URINE CULTURE  CULTURE, BLOOD (ROUTINE X 2)  CULTURE, BLOOD (ROUTINE X 2)  LIPASE, BLOOD    EKG EKG Interpretation  Date/Time:  Wednesday May 20 2020 10:30:35 EDT Ventricular Rate:  109 PR Interval:    QRS Duration: 89 QT Interval:  322 QTC Calculation: 434 R Axis:   60 Text Interpretation: Sinus tachycardia Probable left atrial enlargement Anteroseptal infarct, old Nonspecific T abnormalities, lateral leads No old tracing to compare Confirmed by Melene Plan [phone]) on 05/20/2020 10:37:59 AM   Radiology DG Chest 2 View  Result Date: 05/20/2020 CLINICAL DATA:  Fatigue with lower extremity edema. EXAM: CHEST - 2 VIEW COMPARISON:  May 04, 2020 FINDINGS: There is underlying interstitial thickening, likely fibrotic change. There is no appreciable edema or consolidation. The heart is upper normal in size with pulmonary vascularity normal. Central peribronchial thickening noted. There is aortic atherosclerosis. No adenopathy. There are calcified breast implants bilaterally. Bones appear osteoporotic. There are surgical clips in the right neck region. There is calcification  in each axillary artery. IMPRESSION: Suspected underlying interstitial fibrosis. No edema or airspace opacity. Heart upper normal in size. No adenopathy appreciable. Calcified breast implants. Aortic Atherosclerosis (ICD10-I70.0). Axillary artery calcification bilaterally also noted. Bones appear osteoporotic. Electronically Signed   By: Bretta Bang III M.D.   On: 05/20/2020 11:45   CT Head Wo Contrast  Result Date: 05/20/2020 CLINICAL DATA:  Delirium/altered mental status EXAM: CT HEAD WITHOUT CONTRAST TECHNIQUE: Contiguous axial images were obtained from the base of the skull through the vertex without intravenous contrast. COMPARISON:  None. FINDINGS: Brain: There is moderate diffuse atrophy. There is no intracranial mass, hemorrhage extra-axial fluid collection, or midline shift. There is patchy small vessel disease in the centra semiovale bilaterally. Small vessel disease is noted in the posterior limb of each internal capsule. No acute appearing infarct evident. Vascular: No hyperdense vessel. There is calcification in each carotid siphon and distal left vertebral artery region. Skull: The bony calvarium appears intact. Sinuses/Orbits: Visualized paranasal sinuses are clear. There is apparent cataract removal on the left. Orbits otherwise appear symmetric bilaterally. Other: Mastoid air cells are clear. IMPRESSION: Atrophy with patchy supratentorial small vessel disease. No acute infarct. No mass or hemorrhage. There are foci of arterial vascular calcification. Electronically Signed   By: Bretta Bang III M.D.   On: 05/20/2020 11:42   CT ABDOMEN PELVIS W CONTRAST  Result Date: 05/20/2020 CLINICAL DATA:  Fatigue, leg swelling, difficulty eating and drinking the past 2 months. EXAM: CT ABDOMEN AND PELVIS WITH CONTRAST TECHNIQUE: Multidetector CT imaging of the abdomen and pelvis was performed using the standard protocol following bolus administration of intravenous contrast. CONTRAST:   OMNIPAQUE IOHEXOL 300 MG/ML  SOLN COMPARISON:  Chest x-ray from same day. FINDINGS: Lower chest: Borderline cardiomegaly with enlarged right atrium. Small right greater than left pleural effusions. Centrilobular emphysema. Hepatobiliary: Subcentimeter low-density lesion in the right hepatic lobe is too small to characterize. No other focal liver abnormality. Somewhat contracted gallbladder with diffuse wall thickening. No gallstones or biliary dilatation. Pancreas: Unremarkable. No pancreatic ductal dilatation or surrounding inflammatory changes.  Spleen: Normal in size without focal abnormality. Adrenals/Urinary Tract: 1.5 cm indeterminate lesion in the left adrenal gland. The right adrenal gland is unremarkable. No renal calculi, solid lesion, or hydronephrosis. The bladder is underdistended. Stomach/Bowel: Stomach is within normal limits. Appendix appears normal. No evidence of bowel wall thickening, distention, or inflammatory changes. Extensive colonic diverticulosis. Vascular/Lymphatic: Extensive aortoiliac atherosclerosis. 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 No enlarged abdominal or pelvic lymph nodes. Reproductive: Fibroid uterus. Some fibroids are calcified, others are cystically degenerated. No adnexal mass. Other: Small ascites.  No pneumoperitoneum. Musculoskeletal: No acute or significant osseous findings. Anasarca. IMPRESSION: 1. No acute intra-abdominal process. 2. Small right greater than left pleural effusions, small ascites, and anasarca, consistent with volume overload. 3. 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 4. Indeterminate 1.5 cm left adrenal lesion. Consider follow-up adrenal protocol CT with and without contrast in 12 months. This recommendation follows ACR consensus guidelines: Management of Incidental Adrenal Masses: A White Paper of the ACR Incidental Findings Committee. J Am Coll Radiol 2017;14:1038-1044. 5. Aortic  Atherosclerosis (ICD10-I70.0) and Emphysema (ICD10-J43.9). Electronically Signed   By: Obie Dredge M.D.   On: 05/20/2020 12:53    Procedures Procedures (including critical care time)  Medications Ordered in ED Medications  iohexol (OMNIPAQUE) 300 MG/ML solution 100 mL (100 mLs Intravenous Contrast Given 05/20/20 1225)  furosemide (LASIX) injection 20 mg (20 mg Intravenous Given 05/20/20 1342)    ED Course  I have reviewed the triage vital signs and the nursing notes.  Pertinent labs & imaging results that were available during my care of the patient were reviewed by me and considered in my medical decision making (see chart for details).    MDM Rules/Calculators/A&P                          79 yo F with a chief complaints of fatigue lower extremity edema decreased oral intake.  Off and on for a couple months but worsening over the past week.  Her son came to visit and noticed that her legs were significantly swollen and this is never happened to her before.  She is endorsing some orthopnea and PND.  Clinically is fluid overloaded.  Will obtain a laboratory evaluation, CT of the head CT abdomen pelvis for epigastric abdominal pain reassess.  Work-up most consistent with new heart failure.  Elevated BNP, chest x-ray with likely small pleural effusions.  CT scan of the abdomen pelvis with pleural effusions and no obvious cause of her abdominal pain.  Troponin very mildly elevated no active chest pain or shortness of breath.  Patient is very fatigued and has difficulty even standing.  Feels she would likely benefit from admission with diuresis.  Will discuss with the hospitalist.  The patients results and plan were reviewed and discussed.   Any x-rays performed were independently reviewed by myself.   Differential diagnosis were considered with the presenting HPI.  Medications  iohexol (OMNIPAQUE) 300 MG/ML solution 100 mL (100 mLs Intravenous Contrast Given 05/20/20 1225)    furosemide (LASIX) injection 20 mg (20 mg Intravenous Given 05/20/20 1342)    Vitals:   05/20/20 1239 05/20/20 1341 05/20/20 1416 05/20/20 1430  BP:  126/75  115/65  Pulse: (!) 102 (!) 109 (!) 106 (!) 103  Resp: (!) 24 (!) 22 (!) 22 (!) 23  Temp:      SpO2: 100% 99% 95% 99%    Final diagnoses:  Acute heart failure, unspecified heart failure type [facility](HCC)    Admission/ observation were discussed with the admitting physician, patient and/or family and they are comfortable with the plan.   Final Clinical Impression(s) / ED Diagnoses Final diagnoses:  Acute heart failure, unspecified heart failure type [facility](HCC)    Rx / DC Orders ED Discharge Orders    None       Melene PlanFloyd, Huck Ashworth, DO 05/20/20 1444

## 2020-05-20 NOTE — ED Triage Notes (Signed)
Pt here with bilateral leg swelling, difficulty eating and drinking and AMS x 2 months. Possible JVD visible.

## 2020-05-20 NOTE — ED Notes (Signed)
Assisted to bedside commode

## 2020-05-20 NOTE — ED Notes (Signed)
PT on admission qs vital signs with cardiac monitoring.

## 2020-05-20 NOTE — ED Notes (Signed)
ED Provider at bedside. 

## 2020-05-21 ENCOUNTER — Inpatient Hospital Stay (HOSPITAL_COMMUNITY): Payer: Medicare (Managed Care)

## 2020-05-21 DIAGNOSIS — E43 Unspecified severe protein-calorie malnutrition: Secondary | ICD-10-CM | POA: Insufficient documentation

## 2020-05-21 DIAGNOSIS — I5023 Acute on chronic systolic (congestive) heart failure: Secondary | ICD-10-CM

## 2020-05-21 LAB — BASIC METABOLIC PANEL
Anion gap: 12 (ref 5–15)
BUN: 16 mg/dL (ref 8–23)
CO2: 23 mmol/L (ref 22–32)
Calcium: 8.3 mg/dL — ABNORMAL LOW (ref 8.9–10.3)
Chloride: 101 mmol/L (ref 98–111)
Creatinine, Ser: 1 mg/dL (ref 0.44–1.00)
GFR calc Af Amer: >60 mL/min (ref >60)
GFR calc non Af Amer: 54 mL/min — ABNORMAL LOW (ref >60)
Glucose, Bld: 133 mg/dL — ABNORMAL HIGH (ref 70–99)
Potassium: 4.3 mmol/L (ref 3.5–5.1)
Sodium: 136 mmol/L (ref 135–145)

## 2020-05-21 LAB — HEMOGLOBIN A1C
Hgb A1c MFr Bld: 9.9 % — ABNORMAL HIGH (ref 4.8–5.6)
Mean Plasma Glucose: 237.43 mg/dL

## 2020-05-21 LAB — VITAMIN B12: Vitamin B-12: 123 pg/mL — ABNORMAL LOW (ref 180–914)

## 2020-05-21 LAB — URINE CULTURE

## 2020-05-21 LAB — GLUCOSE, CAPILLARY
Glucose-Capillary: 175 mg/dL — ABNORMAL HIGH (ref 70–99)
Glucose-Capillary: 162 mg/dL — ABNORMAL HIGH (ref 70–99)
Glucose-Capillary: 175 mg/dL — ABNORMAL HIGH (ref 70–99)

## 2020-05-21 LAB — LIPID PANEL
Cholesterol: 189 mg/dL (ref 0–200)
HDL: 27 mg/dL — ABNORMAL LOW (ref >40)
LDL Cholesterol: 141 mg/dL — ABNORMAL HIGH (ref 0–99)
Total CHOL/HDL Ratio: 7 RATIO
Triglycerides: 107 mg/dL (ref <150)
VLDL: 21 mg/dL (ref 0–40)

## 2020-05-21 LAB — CBC
HCT: 33.5 % — ABNORMAL LOW (ref 36.0–46.0)
Hemoglobin: 9.8 g/dL — ABNORMAL LOW (ref 12.0–15.0)
MCH: 23.7 pg — ABNORMAL LOW (ref 26.0–34.0)
MCHC: 29.3 g/dL — ABNORMAL LOW (ref 30.0–36.0)
MCV: 80.9 fL (ref 80.0–100.0)
Platelets: 324 10*3/uL (ref 150–400)
RBC: 4.14 MIL/uL (ref 3.87–5.11)
RDW: 18.5 % — ABNORMAL HIGH (ref 11.5–15.5)
WBC: 6.1 10*3/uL (ref 4.0–10.5)
nRBC: 0 % (ref 0.0–0.2)

## 2020-05-21 LAB — ECHOCARDIOGRAM COMPLETE
Area-P 1/2: 5.38 cm2
S' Lateral: 4.2 cm
Weight: 1516.76 oz

## 2020-05-21 LAB — TROPONIN I (HIGH SENSITIVITY): Troponin I (High Sensitivity): 43 ng/L — ABNORMAL HIGH (ref <18)

## 2020-05-21 LAB — TSH: TSH: 4.479 u[IU]/mL (ref 0.350–4.500)

## 2020-05-21 MED ORDER — PRAVASTATIN SODIUM 40 MG PO TABS
80.0000 mg | ORAL_TABLET | Freq: Every day | ORAL | Status: DC
  Administered 2020-05-21: 80 mg via ORAL
  Filled 2020-05-21: qty 2

## 2020-05-21 MED ORDER — CYANOCOBALAMIN 1000 MCG/ML IJ SOLN
1000.0000 ug | Freq: Every day | INTRAMUSCULAR | Status: AC
  Administered 2020-05-22 – 2020-05-23 (×2): 1000 ug via INTRAMUSCULAR
  Filled 2020-05-21 (×3): qty 1

## 2020-05-21 MED ORDER — PROSOURCE PLUS PO LIQD
30.0000 mL | Freq: Two times a day (BID) | ORAL | Status: DC
  Administered 2020-05-21 – 2020-05-24 (×4): 30 mL via ORAL
  Filled 2020-05-21 (×7): qty 30

## 2020-05-21 MED ORDER — FUROSEMIDE 10 MG/ML IJ SOLN
20.0000 mg | Freq: Two times a day (BID) | INTRAMUSCULAR | Status: DC
  Administered 2020-05-21 – 2020-05-22 (×3): 20 mg via INTRAVENOUS
  Filled 2020-05-21 (×2): qty 2

## 2020-05-21 MED ORDER — ADULT MULTIVITAMIN W/MINERALS CH
1.0000 | ORAL_TABLET | Freq: Every day | ORAL | Status: DC
  Administered 2020-05-21 – 2020-05-25 (×4): 1 via ORAL
  Filled 2020-05-21 (×5): qty 1

## 2020-05-21 NOTE — Progress Notes (Addendum)
Initial Nutrition Assessment  DOCUMENTATION CODES:   Severe malnutrition in context of chronic illness  INTERVENTION:   MVI daily  Snacks TID  63ml Prosource Plus po BID, each supplement provides 100 kcal and 15 grams of protein   NUTRITION DIAGNOSIS:   Severe Malnutrition related to chronic illness as evidenced by severe fat depletion, severe muscle depletion, energy intake < or equal to 75% for > or equal to 1 month.    GOAL:   Patient will meet greater than or equal to 90% of their needs    MONITOR:   PO intake, Labs, Weight trends, I & O's  REASON FOR ASSESSMENT:   Malnutrition Screening Tool    ASSESSMENT:   Pt admitted with new onset CHF. PMH includes HTN, type 2 DM, and HLD.   Discussed pt with RN.   Pt reports a poor appetite and states that her appetite has been poor for several months. Pt states she has been eating two meals per day, but struggles to incorporate protein regularly. Pt states she is not able to keep down oral nutrition supplements like Ensure and Boost. Pt agreeable to trying snacks and Prosource.   Limited wt history available for review; no significant changes noted.   No PO intake documented.   Labs: CBGs 175-228 Medications: Lasix, Novolog  NUTRITION - FOCUSED PHYSICAL EXAM:    Most Recent Value  Orbital Region Mild depletion  Upper Arm Region Severe depletion  Thoracic and Lumbar Region Severe depletion  Buccal Region Severe depletion  Temple Region Mild depletion  Clavicle Bone Region Severe depletion  Clavicle and Acromion Bone Region Severe depletion  Scapular Bone Region Severe depletion  Dorsal Hand Severe depletion  Patellar Region Severe depletion  Anterior Thigh Region Severe depletion  Posterior Calf Region Severe depletion  Edema (RD Assessment) None  Hair Reviewed  Eyes Reviewed  Mouth Reviewed  Skin Reviewed  Nails Reviewed       Diet Order:   Diet Order            Diet Heart Room service  appropriate? Yes; Fluid consistency: Thin  Diet effective now                 EDUCATION NEEDS:   No education needs have been identified at this time  Skin:  Skin Assessment: Reviewed RN Assessment  Last BM:  PTA  Height:   Ht Readings from Last 1 Encounters:  01/15/19 5' (1.524 m)    Weight:   Wt Readings from Last 2 Encounters:  05/21/20 43 kg  01/15/19 39.5 kg    BMI:  Body mass index is 18.51 kg/m.  Estimated Nutritional Needs:   Kcal:  1250-1450  Protein:  60-75 grams  Fluid:  >/=1.25L/d    Eugene Gavia, MS, RD, LDN RD pager number and weekend/on-call pager number located in Amion.

## 2020-05-21 NOTE — Progress Notes (Signed)
PROGRESS NOTE    Jillian Webb  VQQ:[id number] DOB: [birth year] DOA: 05/20/2020 PCP: Cheron Schaumann., MD   Brief Narrative: 79 year old with past medical history significant for hypertension, diabetes type 2, hyperlipidemia who presents complaining of increasing lower extremity edema shortness of breath, panic attack.  Evaluation in the ED patient was tachycardic, tachypneic, CT abdomen and pelvis show borderline cardiomegaly with a small right greater than left pleural effusion.  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  Elevated BNP at 3000, initial troponin 33, lactic acid 2.4.  Patient admitted for evaluation of heart failure  Assessment & Plan:   Principal Problem:   Acute CHF (congestive heart failure) (HCC) Active Problems:   Fluid overload   Hypokalemia   Elevated troponin   Lactic acidosis   Lesion of adrenal gland (HCC)   Type 2 diabetes mellitus with hyperlipidemia (HCC)   HTN (hypertension)   Superior mesenteric artery atherosclerosis (HCC)   1-New onset heart failure, suspect diastolic. Echo pending Presents with dyspnea, lower extremity edema elevated BNP. Continue with IV Lexis 20 mg IV twice daily  2-Mild elevation of troponin, demand ischemia.  Follow echo  3-Lactic acidosis; from decreased perfusion.  Follow  4-Severe stenosis of proximal SMA: Denies abdominal pain, denies abdominal pain after eating. Agree with increase statins dose.  LDL 141.  5-left adrenal lesion: She will need follow-up CT scan in a year.  She will also need hormone work-up as an outpatient  6-type 2 diabetes: Continue with a sliding scale insulin  Normocytic anemia: B12 low at 123 start intramuscular supplementation  Tobacco use: Cessation encouraged   Estimated body mass index is 18.51 kg/m as calculated from the following:   Height as of 01/15/19: 5' (1.524 m).   Weight as of this encounter: 43 kg.   DVT  prophylaxis: Lovenox Code Status: DNR Family Communication: Discussed with son who was at bedside Disposition Plan:  Status is: Inpatient  Remains inpatient appropriate because:Ongoing diagnostic testing needed not appropriate for outpatient work up   Dispo: The patient is from: Home              Anticipated d/c is to: Home              Anticipated d/c date is: 2 days              Patient currently is not medically stable to d/c.        Consultants:   none  Procedures:   Echo pending  Antimicrobials:    Subjective: She report lower extremity edema for the last week or 2.  She denies weight gain.  She reports shortness of breath at times but also report panic attack  Objective: Vitals:   05/20/20 2104 05/21/20 0032 05/21/20 0411 05/21/20 0500  BP:  (!) 116/58 122/74   Pulse:  84 98   Resp:  18 20   Temp:  98.9 F (37.2 C) 97.8 F (36.6 C)   TempSrc:  Oral Oral   SpO2:  93% 100%   Weight: 43 kg   43 kg    Intake/Output Summary (Last 24 hours) at 05/21/2020 0850 Last data filed at 05/21/2020 0600 Gross per 24 hour  Intake 300 ml  Output 555 ml  Net -255 ml   Filed Weights   05/20/20 2104 05/21/20 0500  Weight: 43 kg 43 kg    Examination:  General exam: Appears calm and comfortable  Respiratory system: Bilateral crackers Cardiovascular system:  S1 & S2 heard, RRR.  Gastrointestinal system: Abdomen is nondistended, soft and nontender. No organomegaly or masses felt. Normal bowel sounds heard. Central nervous system: Alert and oriented. No focal neurological deficits. Extremities: Symmetric 5 x 5 power.  +2 edema Skin: No rashes, lesions or ulcers    Data Reviewed: I have personally reviewed following labs and imaging studies  CBC: Recent Labs  Lab 05/20/20 1036 05/21/20 0240  WBC 6.6 6.1  NEUTROABS 5.4  --   HGB 11.1* 9.8*  HCT 37.7 33.5*  MCV 81.1 80.9  PLT 367 324   Basic Metabolic Panel: Recent Labs  Lab 05/20/20 1036 05/21/20 0240    NA 135 136  K 3.3* 4.3  CL 99 101  CO2 23 23  GLUCOSE 283* 133*  BUN 16 16  CREATININE 0.86 1.00  CALCIUM 8.6* 8.3*   GFR: CrCl cannot be calculated (Unknown ideal weight.). Liver Function Tests: Recent Labs  Lab 05/20/20 1036  AST 20  ALT 16  ALKPHOS 97  BILITOT 1.1  PROT 6.8  ALBUMIN 3.6   Recent Labs  Lab 05/20/20 1036  LIPASE 24   No results for input(s): AMMONIA in the last 168 hours. Coagulation Profile: No results for input(s): INR, PROTIME in the last 168 hours. Cardiac Enzymes: No results for input(s): CKTOTAL, CKMB, CKMBINDEX, TROPONINI in the last 168 hours. BNP (last 3 results) No results for input(s): PROBNP in the last 8760 hours. HbA1C: Recent Labs    05/21/20 0240  HGBA1C 9.9*   CBG: Recent Labs  Lab 05/20/20 2231 05/21/20 0634  GLUCAP 228* 175*   Lipid Profile: Recent Labs    05/21/20 0240  CHOL 189  HDL 27*  LDLCALC 141*  TRIG 107  CHOLHDL 7.0   Thyroid Function Tests: Recent Labs    05/21/20 0240  TSH 4.479   Anemia Panel: No results for input(s): VITAMINB12, FOLATE, FERRITIN, TIBC, IRON, RETICCTPCT in the last 72 hours. Sepsis Labs: Recent Labs  Lab 05/20/20 1116 05/20/20 1345  LATICACIDVEN 2.4* 2.6*    Recent Results (from the past 240 hour(s))  Blood culture (routine x 2)     Status: None (Preliminary result)   Collection Time: 05/20/20 10:36 AM   Specimen: BLOOD  Result Value Ref Range Status   Specimen Description   Final    BLOOD RIGHT ANTECUBITAL Performed at [facility], [address] [place], [place] [address]    Special Requests   Final    BOTTLES DRAWN AEROBIC AND ANAEROBIC Blood Culture adequate volume Performed at [facility], [address] [place], [place] [address]    Culture   Final    NO GROWTH < 24 HOURS Performed at [facility], [address], [place], [place] [address]    Report Status PENDING  Incomplete  SARS Coronavirus 2 by RT PCR (hospital order,  performed in [facility] hospital lab) Nasopharyngeal Nasopharyngeal Swab     Status: None   Collection Time: 05/20/20 11:16 AM   Specimen: Nasopharyngeal Swab  Result Value Ref Range Status   SARS Coronavirus 2 NEGATIVE NEGATIVE Final    Comment: (NOTE) SARS-CoV-2 target nucleic acids are NOT DETECTED.  The SARS-CoV-2 RNA is generally detectable in upper and lower respiratory specimens during the acute phase of infection. The lowest concentration of SARS-CoV-2 viral copies this assay can detect is 250 copies / mL. A negative result does not preclude SARS-CoV-2 infection and should not be used as the sole basis  for treatment or other patient management decisions.  A negative result may occur with improper specimen collection / handling, submission of specimen other than nasopharyngeal swab, presence of viral mutation(s) within the areas targeted by this assay, and inadequate number of viral copies (<250 copies / mL). A negative result must be combined with clinical observations, patient history, and epidemiological information.  Fact Sheet for Patients:   BoilerBrush.com.cy  Fact Sheet for Healthcare Providers: https://pope.com/  This test is not yet approved or  cleared by the [place] FDA and has been authorized for detection and/or diagnosis of SARS-CoV-2 by FDA under an Emergency Use Authorization (EUA).  This EUA will remain in effect (meaning this test can be used) for the duration of the COVID-19 declaration under Section 564(b)(1) of the Act, 21 U.S.C. section 360bbb-3(b)(1), unless the authorization is terminated or revoked sooner.  Performed at [facility], [address] [place], [place] [address]   Blood culture (routine x 2)     Status: None (Preliminary result)   Collection Time: 05/20/20 11:16 AM   Specimen: BLOOD RIGHT FOREARM  Result Value Ref Range Status   Specimen Description   Final     BLOOD RIGHT FOREARM Performed at [facility], [address] [place], [place] [address]    Special Requests   Final    BOTTLES DRAWN AEROBIC AND ANAEROBIC Blood Culture adequate volume Performed at [facility], [address] [place], [place] [address]    Culture   Final    NO GROWTH < 24 HOURS Performed at [facility], [address] [place], [place] [address]    Report Status PENDING  Incomplete         Radiology Studies: DG Chest 2 View  Result Date: 05/20/2020 CLINICAL DATA:  Fatigue with lower extremity edema. EXAM: CHEST - 2 VIEW COMPARISON:  May 04, 2020 FINDINGS: There is underlying interstitial thickening, likely fibrotic change. There is no appreciable edema or consolidation. The heart is upper normal in size with pulmonary vascularity normal. Central peribronchial thickening noted. There is aortic atherosclerosis. No adenopathy. There are calcified breast implants bilaterally. Bones appear osteoporotic. There are surgical clips in the right neck region. There is calcification in each axillary artery. IMPRESSION: Suspected underlying interstitial fibrosis. No edema or airspace opacity. Heart upper normal in size. No adenopathy appreciable. Calcified breast implants. Aortic Atherosclerosis (ICD10-I70.0). Axillary artery calcification bilaterally also noted. Bones appear osteoporotic. Electronically Signed   By: Bretta Bang III M.D.   On: 05/20/2020 11:45   CT Head Wo Contrast  Result Date: 05/20/2020 CLINICAL DATA:  Delirium/altered mental status EXAM: CT HEAD WITHOUT CONTRAST TECHNIQUE: Contiguous axial images were obtained from the base of the skull through the vertex without intravenous contrast. COMPARISON:  None. FINDINGS: Brain: There is moderate diffuse atrophy. There is no intracranial mass, hemorrhage extra-axial fluid collection, or midline shift. There is patchy small vessel disease in the centra semiovale bilaterally. Small  vessel disease is noted in the posterior limb of each internal capsule. No acute appearing infarct evident. Vascular: No hyperdense vessel. There is calcification in each carotid siphon and distal left vertebral artery region. Skull: The bony calvarium appears intact. Sinuses/Orbits: Visualized paranasal sinuses are clear. There is apparent cataract removal on the left. Orbits otherwise appear symmetric bilaterally. Other: Mastoid air cells are clear. IMPRESSION: Atrophy with patchy supratentorial small vessel disease. No acute infarct. No mass or hemorrhage. There are foci of arterial vascular  calcification. Electronically Signed   By: Bretta BangWilliam  Woodruff III M.D.   On: 05/20/2020 11:42   CT ABDOMEN PELVIS W CONTRAST  Result Date: 05/20/2020 CLINICAL DATA:  Fatigue, leg swelling, difficulty eating and drinking the past 2 months. EXAM: CT ABDOMEN AND PELVIS WITH CONTRAST TECHNIQUE: Multidetector CT imaging of the abdomen and pelvis was performed using the standard protocol following bolus administration of intravenous contrast. CONTRAST:  100mL OMNIPAQUE IOHEXOL 300 MG/ML  SOLN COMPARISON:  Chest x-ray from same day. FINDINGS: Lower chest: Borderline cardiomegaly with enlarged right atrium. Small right greater than left pleural effusions. Centrilobular emphysema. Hepatobiliary: Subcentimeter low-density lesion in the right hepatic lobe is too small to characterize. No other focal liver abnormality. Somewhat contracted gallbladder with diffuse wall thickening. No gallstones or biliary dilatation. Pancreas: Unremarkable. No pancreatic ductal dilatation or surrounding inflammatory changes. Spleen: Normal in size without focal abnormality. Adrenals/Urinary Tract: 1.5 cm indeterminate lesion in the left adrenal gland. The right adrenal gland is unremarkable. No renal calculi, solid lesion, or hydronephrosis. The bladder is underdistended. Stomach/Bowel: Stomach is within normal limits. Appendix appears normal. No  evidence of bowel wall thickening, distention, or inflammatory changes. Extensive colonic diverticulosis. Vascular/Lymphatic: Extensive aortoiliac atherosclerosis. 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 No enlarged abdominal or pelvic lymph nodes. Reproductive: Fibroid uterus. Some fibroids are calcified, others are cystically degenerated. No adnexal mass. Other: Small ascites.  No pneumoperitoneum. Musculoskeletal: No acute or significant osseous findings. Anasarca. IMPRESSION: 1. No acute intra-abdominal process. 2. Small right greater than left pleural effusions, small ascites, and anasarca, consistent with volume overload. 3. 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 4. Indeterminate 1.5 cm left adrenal lesion. Consider follow-up adrenal protocol CT with and without contrast in 12 months. This recommendation follows ACR consensus guidelines: Management of Incidental Adrenal Masses: A White Paper of the ACR Incidental Findings Committee. J Am Coll Radiol 2017;14:1038-1044. 5. Aortic Atherosclerosis (ICD10-I70.0) and Emphysema (ICD10-J43.9). Electronically Signed   By: Obie DredgeWilliam T Derry M.D.   On: 05/20/2020 12:53        Scheduled Meds:  enoxaparin (LOVENOX) injection  40 mg Subcutaneous Q24H   furosemide  20 mg Intravenous Daily   insulin aspart  0-5 Units Subcutaneous QHS   insulin aspart  0-9 Units Subcutaneous TID WC   pravastatin  80 mg Oral q1800   sodium chloride flush  3 mL Intravenous Q12H   Continuous Infusions:  sodium chloride       LOS: 1 day    Time spent: 35 minutes    Shalon Councilman A Eshaal Duby, MD [facility]   If 7PM-7AM, please contact night-coverage [email or web address]  05/21/2020, 8:50 AM

## 2020-05-21 NOTE — Evaluation (Addendum)
I agree with the following treatment note after review of the documentation. This session was performed under the supervision of a licensed clinician.   Cindee Salt, DPT  Acute Rehabilitation Services  Pager: [phone]    Physical Therapy Evaluation Patient Details Name: Jillian Webb MRN: [id number] DOB: 1941/04/06 Today's Date: 05/21/2020   History of Present Illness  pt is 79 yo female who presents to the ED with LE edema, fatigue, weakness, abdominal pain and decreased oral intake. Pt is currently being treated for an acute CHF. Pt had only mildly elevated troponin levels. Pt has a PMH of HTN, HLD, T2DM, and SMA stenosis  Clinical Impression  Pt was evaluated and assessed for the above diagnosis and impairments below. Pt required supervision to min guard asssist for all mobility tasks assessed. Pt with LOB X3, however, was able to self correct. Pt was limited in ambulation due to fatigue. She was educated on HEP and energy conservation techniques. Pt lives at home with her son who is available to help 24/7. Recommend HHPT for this pt at d/c. Pt would continue to benefit from acute therapy in order to increase her independence with functional tasks, particularly endurance and LE strength. Will continue to follow acutely.     Follow Up Recommendations Home health PT    Equipment Recommendations  None recommended by PT    Recommendations for Other Services       Precautions / Restrictions Restrictions Weight Bearing Restrictions: No      Mobility  Bed Mobility Overal bed mobility: Needs Assistance Bed Mobility: Supine to Sit     Supine to sit: Supervision;HOB elevated     General bed mobility comments: pt required supervision for safety with bed mobility  Transfers Overall transfer level: Needs assistance Equipment used: None Transfers: Sit to/from Stand Sit to Stand: Min guard         General transfer comment: pt required min guard for safety with sit<>stand  transfer. Pt required increased time for power up to stading  Ambulation/Gait Ambulation/Gait assistance: Min guard Gait Distance (Feet): 65 Feet Assistive device: None Gait Pattern/deviations: Step-through pattern;Narrow base of support Gait velocity: decrreased   General Gait Details: pt was very slow in gait and was noted to have LOB x 3 but was able to self correct. Pt was limited in ambulation due to fatigue. Pt required min guard with ambulation and would occassionally reach for the wall for stability. Pt was educated on RW utilization for decreasing energy expenditure and increasing stability   Stairs            Wheelchair Mobility    Modified Rankin (Stroke Patients Only)       Balance Overall balance assessment: Needs assistance Sitting-balance support: No upper extremity supported;Feet supported Sitting balance-Leahy Scale: Good Sitting balance - Comments: pt able to shift weight in sitting without a LOB    Standing balance support: During functional activity;No upper extremity supported Standing balance-Leahy Scale: Fair Standing balance comment: pt was able to maintain static and dynamic balance without UE support. Pt able to do standing marches without any LOB                Pertinent Vitals/Pain Pain Assessment: No/denies pain    Home Living Family/patient expects to be discharged to:: Private residence Living Arrangements: Children Available Help at Discharge: Family;Available 24 hours/day Type of Home: House Home Access: Level entry     Home Layout: Two level Home Equipment: Grab bars - tub/shower;Walker - 2 wheels;Cane -  single point;Wheelchair - manual      Prior Function Level of Independence: Independent               Hand Dominance        Extremity/Trunk Assessment   Upper Extremity Assessment Upper Extremity Assessment: Generalized weakness    Lower Extremity Assessment Lower Extremity Assessment: Generalized weakness     Cervical / Trunk Assessment Cervical / Trunk Assessment: Normal  Communication   Communication: No difficulties  Cognition Arousal/Alertness: Awake/alert Behavior During Therapy: WFL for tasks assessed/performed Overall Cognitive Status: Within Functional Limits for tasks assessed           General Comments General comments (skin integrity, edema, etc.): pt was educated on LE exercises for decreasing LE edema, energy conservation and use of AD at home to limit fatigue and increase stability. discussed the importance of having her son accessible when she does stair navigation for assistance and take rest breaks. also, discussed the possibility of getting her room on the first floor so she doesn't have to go up 14 stairs.     Exercises     Assessment/Plan    PT Assessment Patient needs continued PT services  PT Problem List Decreased strength;Decreased range of motion;Decreased activity tolerance;Decreased balance;Decreased mobility;Decreased coordination;Decreased knowledge of use of DME;Cardiopulmonary status limiting activity       PT Treatment Interventions Gait training;Stair training;Functional mobility training;Therapeutic activities;Therapeutic exercise;Balance training;DME instruction    PT Goals (Current goals can be found in the Care Plan section)  Acute Rehab PT Goals Patient Stated Goal: get home and decrease fatigue PT Goal Formulation: With patient Time For Goal Achievement: 06/04/20 Potential to Achieve Goals: Good    Frequency Min 3X/week   Barriers to discharge        Co-evaluation               AM-PAC PT "6 Clicks" Mobility  Outcome Measure Help needed turning from your back to your side while in a flat bed without using bedrails?: None Help needed moving from lying on your back to sitting on the side of a flat bed without using bedrails?: None Help needed moving to and from a bed to a chair (including a wheelchair)?: A Little Help needed  standing up from a chair using your arms (e.g., wheelchair or bedside chair)?: A Little Help needed to walk in hospital room?: A Little Help needed climbing 3-5 steps with a railing? : A Lot 6 Click Score: 19    End of Session Equipment Utilized During Treatment: Gait belt Activity Tolerance: Patient tolerated treatment well Patient left: in chair;with call bell/phone within reach Nurse Communication: Mobility status PT Visit Diagnosis: Unsteadiness on feet (R26.81);Muscle weakness (generalized) (M62.81)    Time: 8657-8469 PT Time Calculation (min) (ACUTE ONLY): 20 min   Charges:             Harmon Pier, SPT  Acute Rehabilitation Services  Office: [phone]  05/21/2020, 12:57 PM

## 2020-05-21 NOTE — Plan of Care (Signed)
  Problem: Education: Goal: Ability to demonstrate management of disease process will improve Outcome: Progressing   Problem: Education: Goal: Ability to verbalize understanding of medication therapies will improve Outcome: Progressing   

## 2020-05-21 NOTE — Progress Notes (Signed)
Francis Dowse (Son) wants call tomorrow regarding mothers care because she seems forgetful and cant remember what was said to her. [phone]  Is his number.

## 2020-05-21 NOTE — Plan of Care (Signed)
  Problem: Education: Goal: Ability to verbalize understanding of medication therapies will improve Outcome: Progressing   

## 2020-05-21 NOTE — Progress Notes (Signed)
New Admission Note:   Arrival Method: via carelink from [facility] Mental Orientation: Alert & oriented x3 Telemetry: 5M09, CCMD notified Assessment: Completed Skin: Intact, warm and dry IV: RAC, saline locked Pain: 0/10 Tubes: None Safety Measures: Safety Fall Prevention Plan has been discussed  Admission: completed 5 [place] Orientation: Patient has been orientated to the room, unit and staff.   Family: none at bedside  Orders to be reviewed and implemented. Will continue to monitor the patient. Call light has been placed within reach and bed alarm has been activated.

## 2020-05-21 NOTE — Progress Notes (Signed)
  Echocardiogram 2D Echocardiogram has been performed.  Jillian Webb Rounds 05/21/2020, 3:34 PM

## 2020-05-22 ENCOUNTER — Encounter (HOSPITAL_COMMUNITY)
Admission: EM | Disposition: A | Discharge status: Hospice | Payer: Self-pay | Source: Home / Self Care | Attending: Internal Medicine

## 2020-05-22 DIAGNOSIS — E8779 Other fluid overload: Secondary | ICD-10-CM

## 2020-05-22 DIAGNOSIS — I5021 Acute systolic (congestive) heart failure: Secondary | ICD-10-CM

## 2020-05-22 DIAGNOSIS — R778 Other specified abnormalities of plasma proteins: Secondary | ICD-10-CM

## 2020-05-22 DIAGNOSIS — I2510 Atherosclerotic heart disease of native coronary artery without angina pectoris: Secondary | ICD-10-CM

## 2020-05-22 DIAGNOSIS — R570 Cardiogenic shock: Secondary | ICD-10-CM

## 2020-05-22 DIAGNOSIS — I255 Ischemic cardiomyopathy: Secondary | ICD-10-CM

## 2020-05-22 DIAGNOSIS — E872 Acidosis: Secondary | ICD-10-CM

## 2020-05-22 DIAGNOSIS — E785 Hyperlipidemia, unspecified: Secondary | ICD-10-CM

## 2020-05-22 DIAGNOSIS — E1169 Type 2 diabetes mellitus with other specified complication: Secondary | ICD-10-CM

## 2020-05-22 DIAGNOSIS — I5041 Acute combined systolic (congestive) and diastolic (congestive) heart failure: Secondary | ICD-10-CM

## 2020-05-22 HISTORY — PX: RIGHT/LEFT HEART CATH AND CORONARY ANGIOGRAPHY: CATH118266

## 2020-05-22 LAB — POCT I-STAT 7, (LYTES, BLD GAS, ICA,H+H)
Acid-Base Excess: 3 mmol/L — ABNORMAL HIGH (ref 0.0–2.0)
Bicarbonate: 23.9 mmol/L (ref 20.0–28.0)
Calcium, Ion: 1.1 mmol/L — ABNORMAL LOW (ref 1.15–1.40)
HCT: 33 % — ABNORMAL LOW (ref 36.0–46.0)
Hemoglobin: 11.2 g/dL — ABNORMAL LOW (ref 12.0–15.0)
O2 Saturation: 99 %
Potassium: 3.5 mmol/L (ref 3.5–5.1)
Sodium: 136 mmol/L (ref 135–145)
TCO2: 25 mmol/L (ref 22–32)
pCO2 arterial: 26.3 mmHg — ABNORMAL LOW (ref 32.0–48.0)
pH, Arterial: 7.566 — ABNORMAL HIGH (ref 7.350–7.450)
pO2, Arterial: 106 mmHg (ref 83.0–108.0)

## 2020-05-22 LAB — MAGNESIUM: Magnesium: 1.1 mg/dL — ABNORMAL LOW (ref 1.7–2.4)

## 2020-05-22 LAB — POCT I-STAT EG7
Acid-Base Excess: 4 mmol/L — ABNORMAL HIGH (ref 0.0–2.0)
Acid-Base Excess: 4 mmol/L — ABNORMAL HIGH (ref 0.0–2.0)
Bicarbonate: 28.5 mmol/L — ABNORMAL HIGH (ref 20.0–28.0)
Bicarbonate: 28.6 mmol/L — ABNORMAL HIGH (ref 20.0–28.0)
Calcium, Ion: 1.13 mmol/L — ABNORMAL LOW (ref 1.15–1.40)
Calcium, Ion: 1.13 mmol/L — ABNORMAL LOW (ref 1.15–1.40)
HCT: 33 % — ABNORMAL LOW (ref 36.0–46.0)
HCT: 33 % — ABNORMAL LOW (ref 36.0–46.0)
Hemoglobin: 11.2 g/dL — ABNORMAL LOW (ref 12.0–15.0)
Hemoglobin: 11.2 g/dL — ABNORMAL LOW (ref 12.0–15.0)
O2 Saturation: 38 %
O2 Saturation: 41 %
Potassium: 3.5 mmol/L (ref 3.5–5.1)
Potassium: 3.5 mmol/L (ref 3.5–5.1)
Sodium: 136 mmol/L (ref 135–145)
Sodium: 137 mmol/L (ref 135–145)
TCO2: 30 mmol/L (ref 22–32)
TCO2: 30 mmol/L (ref 22–32)
pCO2, Ven: 42.3 mmHg — ABNORMAL LOW (ref 44.0–60.0)
pCO2, Ven: 43 mmHg — ABNORMAL LOW (ref 44.0–60.0)
pH, Ven: 7.431 — ABNORMAL HIGH (ref 7.250–7.430)
pH, Ven: 7.436 — ABNORMAL HIGH (ref 7.250–7.430)
pO2, Ven: 22 mmHg — CL (ref 32.0–45.0)
pO2, Ven: 23 mmHg — CL (ref 32.0–45.0)

## 2020-05-22 LAB — CBC
HCT: 32 % — ABNORMAL LOW (ref 36.0–46.0)
Hemoglobin: 9.7 g/dL — ABNORMAL LOW (ref 12.0–15.0)
MCH: 24.1 pg — ABNORMAL LOW (ref 26.0–34.0)
MCHC: 30.3 g/dL (ref 30.0–36.0)
MCV: 79.6 fL — ABNORMAL LOW (ref 80.0–100.0)
Platelets: 308 10*3/uL (ref 150–400)
RBC: 4.02 MIL/uL (ref 3.87–5.11)
RDW: 18.3 % — ABNORMAL HIGH (ref 11.5–15.5)
WBC: 6.7 10*3/uL (ref 4.0–10.5)
nRBC: 0 % (ref 0.0–0.2)

## 2020-05-22 LAB — GLUCOSE, CAPILLARY
Glucose-Capillary: 227 mg/dL — ABNORMAL HIGH (ref 70–99)
Glucose-Capillary: 290 mg/dL — ABNORMAL HIGH (ref 70–99)
Glucose-Capillary: 100 mg/dL — ABNORMAL HIGH (ref 70–99)
Glucose-Capillary: 262 mg/dL — ABNORMAL HIGH (ref 70–99)

## 2020-05-22 LAB — BASIC METABOLIC PANEL
Anion gap: 11 (ref 5–15)
BUN: 19 mg/dL (ref 8–23)
CO2: 22 mmol/L (ref 22–32)
Calcium: 8 mg/dL — ABNORMAL LOW (ref 8.9–10.3)
Chloride: 98 mmol/L (ref 98–111)
Creatinine, Ser: 0.93 mg/dL (ref 0.44–1.00)
GFR calc Af Amer: >60 mL/min (ref >60)
GFR calc non Af Amer: 58 mL/min — ABNORMAL LOW (ref >60)
Glucose, Bld: 255 mg/dL — ABNORMAL HIGH (ref 70–99)
Potassium: 3.4 mmol/L — ABNORMAL LOW (ref 3.5–5.1)
Sodium: 131 mmol/L — ABNORMAL LOW (ref 135–145)

## 2020-05-22 LAB — LACTIC ACID, PLASMA: Lactic Acid, Venous: 3.8 mmol/L (ref 0.5–1.9)

## 2020-05-22 SURGERY — RIGHT/LEFT HEART CATH AND CORONARY ANGIOGRAPHY
Anesthesia: LOCAL

## 2020-05-22 MED ORDER — SERTRALINE HCL 25 MG PO TABS
50.0000 mg | ORAL_TABLET | Freq: Every day | ORAL | Status: DC
  Administered 2020-05-23 – 2020-05-25 (×3): 50 mg via ORAL
  Filled 2020-05-22: qty 1
  Filled 2020-05-22 (×3): qty 2

## 2020-05-22 MED ORDER — SODIUM CHLORIDE 0.9 % IV SOLN: 250.0000 mL | INTRAVENOUS | Status: DC | PRN

## 2020-05-22 MED ORDER — LABETALOL HCL 5 MG/ML IV SOLN: 10.0000 mg | INTRAVENOUS | Status: AC | PRN

## 2020-05-22 MED ORDER — MIDAZOLAM HCL 2 MG/2ML IJ SOLN
INTRAMUSCULAR | Status: AC
  Filled 2020-05-22: qty 2

## 2020-05-22 MED ORDER — ATORVASTATIN CALCIUM 80 MG PO TABS
80.0000 mg | ORAL_TABLET | Freq: Every day | ORAL | Status: DC
  Administered 2020-05-22 – 2020-05-25 (×4): 80 mg via ORAL
  Filled 2020-05-22 (×4): qty 1

## 2020-05-22 MED ORDER — HEPARIN (PORCINE) IN NACL 1000-0.9 UT/500ML-% IV SOLN
INTRAVENOUS | Status: AC
  Filled 2020-05-22: qty 1000

## 2020-05-22 MED ORDER — HEPARIN SODIUM (PORCINE) 5000 UNIT/ML IJ SOLN
5000.0000 [IU] | Freq: Three times a day (TID) | INTRAMUSCULAR | Status: DC
  Administered 2020-05-23 – 2020-05-25 (×7): 5000 [IU] via SUBCUTANEOUS
  Filled 2020-05-22 (×7): qty 1

## 2020-05-22 MED ORDER — ATROPINE SULFATE 1 MG/10ML IJ SOSY
PREFILLED_SYRINGE | INTRAMUSCULAR | Status: AC
  Filled 2020-05-22: qty 10

## 2020-05-22 MED ORDER — HYDRALAZINE HCL 20 MG/ML IJ SOLN: 10.0000 mg | INTRAMUSCULAR | Status: AC | PRN

## 2020-05-22 MED ORDER — ASPIRIN 81 MG PO CHEW
CHEWABLE_TABLET | ORAL | Status: DC | PRN
  Administered 2020-05-22: 81 mg via ORAL

## 2020-05-22 MED ORDER — SODIUM CHLORIDE 0.9% FLUSH
3.0000 mL | Freq: Two times a day (BID) | INTRAVENOUS | Status: DC
  Administered 2020-05-23 – 2020-05-24 (×4): 3 mL via INTRAVENOUS

## 2020-05-22 MED ORDER — ACETAMINOPHEN 325 MG PO TABS: 650.0000 mg | ORAL_TABLET | ORAL | Status: DC | PRN

## 2020-05-22 MED ORDER — SODIUM CHLORIDE 0.9% FLUSH: 3.0000 mL | Freq: Two times a day (BID) | INTRAVENOUS | Status: DC

## 2020-05-22 MED ORDER — LIDOCAINE HCL (PF) 1 % IJ SOLN
INTRAMUSCULAR | Status: DC | PRN
  Administered 2020-05-22: 15 mL

## 2020-05-22 MED ORDER — MIDAZOLAM HCL 2 MG/2ML IJ SOLN
INTRAMUSCULAR | Status: DC | PRN
  Administered 2020-05-22 (×2): 1 mg via INTRAVENOUS

## 2020-05-22 MED ORDER — IOHEXOL 350 MG/ML SOLN
INTRAVENOUS | Status: DC | PRN
  Administered 2020-05-22: 70 mL

## 2020-05-22 MED ORDER — ONDANSETRON HCL 4 MG/2ML IJ SOLN: 4.0000 mg | Freq: Four times a day (QID) | INTRAMUSCULAR | Status: DC | PRN

## 2020-05-22 MED ORDER — ASPIRIN 81 MG PO CHEW: 81.0000 mg | CHEWABLE_TABLET | ORAL | Status: DC

## 2020-05-22 MED ORDER — ASPIRIN 81 MG PO CHEW
CHEWABLE_TABLET | ORAL | Status: AC
  Filled 2020-05-22: qty 1

## 2020-05-22 MED ORDER — ASPIRIN 81 MG PO CHEW
81.0000 mg | CHEWABLE_TABLET | Freq: Every day | ORAL | Status: DC
  Administered 2020-05-23 – 2020-05-25 (×3): 81 mg via ORAL
  Filled 2020-05-22 (×3): qty 1

## 2020-05-22 MED ORDER — FUROSEMIDE 10 MG/ML IJ SOLN
40.0000 mg | Freq: Three times a day (TID) | INTRAMUSCULAR | Status: DC
  Administered 2020-05-22: 40 mg via INTRAVENOUS
  Filled 2020-05-22: qty 4

## 2020-05-22 MED ORDER — POTASSIUM CHLORIDE CRYS ER 20 MEQ PO TBCR
40.0000 meq | EXTENDED_RELEASE_TABLET | ORAL | Status: AC
  Filled 2020-05-22 (×2): qty 2

## 2020-05-22 MED ORDER — MAGNESIUM SULFATE 2 GM/50ML IV SOLN
2.0000 g | Freq: Once | INTRAVENOUS | Status: AC
  Administered 2020-05-22: 2 g via INTRAVENOUS
  Filled 2020-05-22: qty 50

## 2020-05-22 MED ORDER — LIDOCAINE HCL (PF) 1 % IJ SOLN
INTRAMUSCULAR | Status: AC
  Filled 2020-05-22: qty 30

## 2020-05-22 MED ORDER — SODIUM CHLORIDE 0.9 % IV SOLN: INTRAVENOUS | Status: AC

## 2020-05-22 MED ORDER — HEPARIN (PORCINE) IN NACL 1000-0.9 UT/500ML-% IV SOLN
INTRAVENOUS | Status: DC | PRN
  Administered 2020-05-22 (×2): 500 mL

## 2020-05-22 MED ORDER — SODIUM CHLORIDE 0.9% FLUSH: 3.0000 mL | INTRAVENOUS | Status: DC | PRN

## 2020-05-22 SURGICAL SUPPLY — 11 items
CATH INFINITI 5FR MULTPACK ANG (CATHETERS) ×1 IMPLANT
CATH SWAN GANZ 7F STRAIGHT (CATHETERS) ×1 IMPLANT
KIT HEART LEFT (KITS) ×2 IMPLANT
PACK CARDIAC CATHETERIZATION (CUSTOM PROCEDURE TRAY) ×2 IMPLANT
SHEATH PINNACLE 5F 10CM (SHEATH) ×1 IMPLANT
SHEATH PINNACLE 7F 10CM (SHEATH) ×1 IMPLANT
SYR MEDRAD MARK 7 150ML (SYRINGE) ×2 IMPLANT
TRANSDUCER W/STOPCOCK (MISCELLANEOUS) ×2 IMPLANT
TUBING CIL FLEX 10 FLL-RA (TUBING) ×2 IMPLANT
WIRE EMERALD 3MM-J .035X150CM (WIRE) ×1 IMPLANT
WIRE HI TORQ VERSACORE-J 145CM (WIRE) ×1 IMPLANT

## 2020-05-22 NOTE — Progress Notes (Signed)
Inpatient Diabetes Program Recommendations  AACE/ADA: New Consensus Statement on Inpatient Glycemic Control (2015)  Target Ranges:  Prepandial:   less than 140 mg/dL      Peak postprandial:   less than 180 mg/dL (1-2 hours)      Critically ill patients:  140 - 180 mg/dL   Lab Results  Component Value Date   GLUCAP 290 (H) 05/22/2020   HGBA1C 9.9 (H) 05/21/2020    Review of Glycemic Control Results for Jillian Webb, Jillian Webb (MRN [id number]) as of 05/22/2020 12:16  Ref. Range 05/21/2020 06:34 05/21/2020 17:24 05/21/2020 21:14 05/22/2020 07:01 05/22/2020 11:11  Glucose-Capillary Latest Ref Range: 70 - 99 mg/dL 774 (H) 128 (H) 786 (H) 227 (H) 290 (H)   Diabetes history:  DM2  Outpatient Diabetes medications:  Metformin 1000 mg bid Glipizide 5 mg daily  Current orders for Inpatient glycemic control:  Novolog 0-9 units tid  Noovlog 0-5 units qhs  Inpatient Diabetes Program Recommendations:     If cbg's remain elevated please consider,  Lantus 7 units daily (0.15 units/kg) May benefit from 2 units meal coverage if eats at least 50% of meals if postprandials elevated  Will continue to follow while inpatient.  Thank you, Dulce Sellar, RN, BSN Diabetes Coordinator Inpatient Diabetes Program [phone] (team pager from 8a-5p)

## 2020-05-22 NOTE — Consult Note (Addendum)
Cardiology Consultation:   Patient ID: Jillian Webb MRN: [id number]; DOB: 1940-11-06  Admit date: 05/20/2020 Date of Consult: 05/22/2020  Primary Care Provider: Cheron Schaumann., MD [facility] Cardiologist:[facility] Electrophysiologist:  None    Patient Profile:   Jillian Webb is a 79 y.o. female with a hx of DM2, psoriasis, HLD, R carotid endarterectomy (20 years ago)  who is being seen today for the evaluation of CHF at the request of Dr. Sunnie Nielsen.  History of Present Illness:   Jillian Webb has no prior history of MI or stent. Says she has not seen cardiology in the past. No cath or stress test in prior records. She has history of hyperlipidemia on statin. Reported R carotid endarterectomy 20 years ago. She was not having symptoms at that time. She has long history of rare psoriasis. She is a lifelong smoker. No alcohol or drug use. She lives were her son however is able to perform all ADLs. She does not sleep well at night which attributes to anxiety. About 2 months ago the patient was treated for pneumonia. At that time patient noted to have some minor leg edema.   The patient presented to [facility] ED 05/20/20 for lower leg edema and mild sob. Her son also noted some confusion. LLEstarted a couple months ago and has been progressive. Her son noticed lower leg swelling 3 weeks ago. Patient reported mild sob on exertion. Overall though she has been feeling generally weak and tired since bought of pneumonia. She has not had energy to do things as before. Denies chest pain, palpitations, orthopnea, PND. No fever, chills. Says she felt sick about a week ago, stomach upset. Appetite has recently been low.   In the ED BP 115/65, pulse 103, afebrile, RR 23, 99% O2. JVD noted on exam as well as LLE. Labs showed Hgb 11.1, potassium 3.3, glucose 283, calcium 8.6. BNP 3,975, UA with protein, LA 2.6. HS troponin 33. CT head negative for acute process. CXR with interstitial fibrosis.  CT abdomen/pelvis noted small pleural effusions, small ascites, severe stenosis of prox SMA, adrenal lesion, aortic atherosclerosis and emphysema. Patient was transferred to [facility] for admission and further work-up.   On my exam the patient is alert and oriented x 3. Denies baseline dementia. Son and patient feel the confusion started when the pneumonia started and has been intermittent. No chest pain. Still volume up and has intermittent soft pressures.   Echo: LVEF 20%, G3DD, mod dilated La and RA, mod MR, severe TR   Past Medical History:  Diagnosis Date  . Depression   . Diabetes mellitus without complication (HCC)   . Hyperlipidemia   . Psoriasis     Past Surgical History:  Procedure Laterality Date  . ABDOMINAL HYSTERECTOMY    . CAROTID ARTERY ANGIOPLASTY    . HEMORROIDECTOMY       Home Medications:  Prior to Admission medications   Medication Sig Start Date End Date Taking? Authorizing Provider  gabapentin (NEURONTIN) 100 MG capsule Take 300 mg by mouth at bedtime.  05/18/18  Yes [provider]  glipiZIDE (GLUCOTROL) 5 MG tablet Take 5 mg by mouth daily.  01/03/19  Yes [provider]  metFORMIN (GLUCOPHAGE) 1000 MG tablet Take 1,000 mg by mouth 2 (two) times daily with a meal.   Yes [provider]  rosuvastatin (CRESTOR) 10 MG tablet Take 10 mg by mouth at bedtime. 01/28/20  Yes [provider]  sertraline (ZOLOFT) 50 MG tablet Take 50 mg by  mouth daily. 01/27/20  Yes [provider]    Inpatient Medications: Scheduled Meds: . (feeding supplement) PROSource Plus  30 mL Oral BID BM  . cyanocobalamin  1,000 mcg Intramuscular Daily  . enoxaparin (LOVENOX) injection  40 mg Subcutaneous Q24H  . furosemide  20 mg Intravenous BID  . insulin aspart  0-5 Units Subcutaneous QHS  . insulin aspart  0-9 Units Subcutaneous TID WC  . multivitamin with minerals  1 tablet Oral Daily  . potassium chloride  40 mEq Oral Q4H  . pravastatin  80 mg Oral  q1800  . sodium chloride flush  3 mL Intravenous Q12H   Continuous Infusions: . sodium chloride    . magnesium sulfate bolus IVPB     PRN Meds: sodium chloride, acetaminophen, ondansetron (ZOFRAN) IV, sodium chloride flush  Allergies:    Allergies  Allergen Reactions  . Losartan Rash    erythroderma  . Sulfa Antibiotics Itching and Rash    Social History:   Social History   Socioeconomic History  . Marital status: Widowed    Spouse name: Not on file  . Number of children: Not on file  . Years of education: Not on file  . Highest education level: Not on file  Occupational History  . Not on file  Tobacco Use  . Smoking status: Current Every Day Smoker    Types: Cigarettes  . Smokeless tobacco: Never Used  Vaping Use  . Vaping Use: Never used  Substance and Sexual Activity  . Alcohol use: Never  . Drug use: Never  . Sexual activity: Not on file  Other Topics Concern  . Not on file  Social History Narrative  . Not on file   Social Determinants of Health   Financial Resource Strain:   . Difficulty of Paying Living Expenses: Not on file  Food Insecurity:   . Worried About Programme researcher, broadcasting/film/video in the Last Year: Not on file  . Ran Out of Food in the Last Year: Not on file  Transportation Needs:   . Lack of Transportation (Medical): Not on file  . Lack of Transportation (Non-Medical): Not on file  Physical Activity:   . Days of Exercise per Week: Not on file  . Minutes of Exercise per Session: Not on file  Stress:   . Feeling of Stress : Not on file  Social Connections:   . Frequency of Communication with Friends and Family: Not on file  . Frequency of Social Gatherings with Friends and Family: Not on file  . Attends Religious Services: Not on file  . Active Member of Clubs or Organizations: Not on file  . Attends Banker Meetings: Not on file  . Marital Status: Not on file  Intimate Partner Violence:   . Fear of Current or Ex-Partner: Not on file   . Emotionally Abused: Not on file  . Physically Abused: Not on file  . Sexually Abused: Not on file    Family History:   History reviewed. No pertinent family history.   ROS:  Please see the history of present illness.  All other ROS reviewed and negative.     Physical Exam/Data:   Vitals:   05/21/20 0914 05/21/20 1639 05/21/20 2001 05/22/20 0447  BP: 128/73 (!) 108/54 (!) 106/53 122/63  Pulse: 100 96 95 (!) 102  Resp: Temp: 97.6 F (36.4 C) 97.7 F (36.5 C) 98.1 F (36.7 C) 98.6 F (37 C)  TempSrc: Oral Oral Oral  Oral  SpO2: 98% 99% 97% 98%  Weight:    43.2 kg    Intake/Output Summary (Last 24 hours) at 05/22/2020 1005 Last data filed at 05/22/2020 0600 Gross per 24 hour  Intake 220 ml  Output --  Net 220 ml   Last 3 Weights 05/22/2020 05/21/2020 05/20/2020  Weight (lbs) 95 lb 3.8 oz 94 lb 12.8 oz 94 lb 12.8 oz  Weight (kg) 43.2 kg 43 kg 43 kg     Body mass index is 18.6 kg/m.  General:  Well nourished, well developed, in no acute distress HEENT: normal Lymph: no adenopathy Neck: + JVD Endocrine:  No thryomegaly Vascular: +L carotid bruits; FA pulses 2+ bilaterally without bruits  Cardiac:  normal S1, S2; RRR; + murmur  Lungs:  clear to auscultation bilaterally, no wheezing, rhonchi or rales  Abd: soft, nontender, no hepatomegaly  Ext: B/L edema Musculoskeletal:  No deformities, BUE and BLE strength normal and equal Skin: warm and dry  Neuro:  CNs 2-12 intact, no focal abnormalities noted Psych:  Normal affect   EKG:  The EKG was personally reviewed and demonstrates:  Sinus tach, 109 bpm, TWI lateral leads, q waves V1-V2, nonspecific ST changes Telemetry:  Telemetry was personally reviewed and demonstrates:  NSR, HR 90-100, PVCs, 5bNSVT  Relevant CV Studies:  Echo 05/21/20 1. Left ventricular ejection fraction, by estimation, is 20%. The left  ventricle has severely decreased function. The left ventricle demonstrates  global hypokinesis. Left  ventricular diastolic parameters are consistent  with Grade III diastolic  dysfunction (restrictive).  2. Right ventricular systolic function is mildly reduced. The right  ventricular size is mildly enlarged. There is moderately elevated  pulmonary artery systolic pressure. The estimated right ventricular  systolic pressure is 53.9 mmHg.  3. Left atrial size was moderately dilated.  4. Right atrial size was moderately dilated.  5. The mitral valve is degenerative. Moderate to severe mitral valve  regurgitation. No evidence of mitral stenosis.  6. Tricuspid valve regurgitation is severe.  7. The aortic valve is tricuspid. Aortic valve regurgitation is not  visualized. No aortic stenosis is present.  8. The inferior vena cava is dilated in size with <50% respiratory  variability, suggesting right atrial pressure of 15 mmHg.   Laboratory Data:  High Sensitivity Troponin:   Recent Labs  Lab 05/20/20 1036 05/20/20 2119 05/20/20 2314  TROPONINIHS 33* 45* 43*     Chemistry Recent Labs  Lab 05/20/20 1036 05/21/20 0240 05/22/20 0307  NA 135 136 131*  K 3.3* 4.3 3.4*  CL 99 101 98  CO2 23 23 22   GLUCOSE 283* 133* 255*  BUN 16 16 19   CREATININE 0.86 1.00 0.93  CALCIUM 8.6* 8.3* 8.0*  GFRNONAA >60 54* 58*  GFRAA >60 >60 >60  ANIONGAP 13 12 11     Recent Labs  Lab 05/20/20 1036  PROT 6.8  ALBUMIN 3.6  AST 20  ALT 16  ALKPHOS 97  BILITOT 1.1   Hematology Recent Labs  Lab 05/20/20 1036 05/21/20 0240 05/22/20 0307  WBC 6.6 6.1 6.7  RBC 4.65 4.14 4.02  HGB 11.1* 9.8* 9.7*  HCT 37.7 33.5* 32.0*  MCV 81.1 80.9 79.6*  MCH 23.9* 23.7* 24.1*  MCHC 29.4* 29.3* 30.3  RDW 18.6* 18.5* 18.3*  PLT 367 324 308   BNP Recent Labs  Lab 05/20/20 1036  BNP 3,975.8*    DDimer No results for input(s): DDIMER in the last 168 hours.   Radiology/Studies:  DG Chest 2 View  Result Date: 05/20/2020 CLINICAL DATA:  Fatigue with lower extremity edema. EXAM: CHEST - 2 VIEW  COMPARISON:  May 04, 2020 FINDINGS: There is underlying interstitial thickening, likely fibrotic change. There is no appreciable edema or consolidation. The heart is upper normal in size with pulmonary vascularity normal. Central peribronchial thickening noted. There is aortic atherosclerosis. No adenopathy. There are calcified breast implants bilaterally. Bones appear osteoporotic. There are surgical clips in the right neck region. There is calcification in each axillary artery. IMPRESSION: Suspected underlying interstitial fibrosis. No edema or airspace opacity. Heart upper normal in size. No adenopathy appreciable. Calcified breast implants. Aortic Atherosclerosis (ICD10-I70.0). Axillary artery calcification bilaterally also noted. Bones appear osteoporotic. Electronically Signed   By: Bretta Bang III M.D.   On: 05/20/2020 11:45   CT Head Wo Contrast  Result Date: 05/20/2020 CLINICAL DATA:  Delirium/altered mental status EXAM: CT HEAD WITHOUT CONTRAST TECHNIQUE: Contiguous axial images were obtained from the base of the skull through the vertex without intravenous contrast. COMPARISON:  None. FINDINGS: Brain: There is moderate diffuse atrophy. There is no intracranial mass, hemorrhage extra-axial fluid collection, or midline shift. There is patchy small vessel disease in the centra semiovale bilaterally. Small vessel disease is noted in the posterior limb of each internal capsule. No acute appearing infarct evident. Vascular: No hyperdense vessel. There is calcification in each carotid siphon and distal left vertebral artery region. Skull: The bony calvarium appears intact. Sinuses/Orbits: Visualized paranasal sinuses are clear. There is apparent cataract removal on the left. Orbits otherwise appear symmetric bilaterally. Other: Mastoid air cells are clear. IMPRESSION: Atrophy with patchy supratentorial small vessel disease. No acute infarct. No mass or hemorrhage. There are foci of arterial vascular  calcification. Electronically Signed   By: Bretta Bang III M.D.   On: 05/20/2020 11:42   CT ABDOMEN PELVIS W CONTRAST  Result Date: 05/20/2020 CLINICAL DATA:  Fatigue, leg swelling, difficulty eating and drinking the past 2 months. EXAM: CT ABDOMEN AND PELVIS WITH CONTRAST TECHNIQUE: Multidetector CT imaging of the abdomen and pelvis was performed using the standard protocol following bolus administration of intravenous contrast. CONTRAST:  OMNIPAQUE IOHEXOL 300 MG/ML  SOLN COMPARISON:  Chest x-ray from same day. FINDINGS: Lower chest: Borderline cardiomegaly with enlarged right atrium. Small right greater than left pleural effusions. Centrilobular emphysema. Hepatobiliary: Subcentimeter low-density lesion in the right hepatic lobe is too small to characterize. No other focal liver abnormality. Somewhat contracted gallbladder with diffuse wall thickening. No gallstones or biliary dilatation. Pancreas: Unremarkable. No pancreatic ductal dilatation or surrounding inflammatory changes. Spleen: Normal in size without focal abnormality. Adrenals/Urinary Tract: 1.5 cm indeterminate lesion in the left adrenal gland. The right adrenal gland is unremarkable. No renal calculi, solid lesion, or hydronephrosis. The bladder is underdistended. Stomach/Bowel: Stomach is within normal limits. Appendix appears normal. No evidence of bowel wall thickening, distention, or inflammatory changes. Extensive colonic diverticulosis. Vascular/Lymphatic: Extensive aortoiliac atherosclerosis. 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 No enlarged abdominal or pelvic lymph nodes. Reproductive: Fibroid uterus. Some fibroids are calcified, others are cystically degenerated. No adnexal mass. Other: Small ascites.  No pneumoperitoneum. Musculoskeletal: No acute or significant osseous findings. Anasarca. IMPRESSION: 1. No acute intra-abdominal process. 2. Small right greater than left pleural effusions, small  ascites, and anasarca, consistent with volume overload. 3. 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 4. Indeterminate 1.5 cm left adrenal lesion. Consider follow-up adrenal protocol CT with and without contrast in 12 months. This recommendation follows ACR consensus  guidelines: Management of Incidental Adrenal Masses: A White Paper of the ACR Incidental Findings Committee. J Am Coll Radiol 2017;14:1038-1044. 5. Aortic Atherosclerosis (ICD10-I70.0) and Emphysema (ICD10-J43.9). Electronically Signed   By: Obie Dredge M.D.   On: 05/20/2020 12:53   ECHOCARDIOGRAM COMPLETE  Result Date: 05/21/2020    ECHOCARDIOGRAM REPORT   Patient Name:   Jillian Webb Date of Exam: 05/21/2020 Medical Rec #:  [id number]        Height:       58.0 in Accession #:    [id number]       Weight:       94.8 lb Date of Birth:  Oct 26, 1940        BSA:          1.326 m Patient Age:    79 years         BP:           128/73 mmHg Patient Gender: F                HR:           97 bpm. Exam Location:  Inpatient Procedure: 2D Echo, Cardiac Doppler and Color Doppler Indications:    I50.23 Acute on chronic systolic (congestive) heart failure  History:        Patient has no prior history of Echocardiogram examinations.                 Risk Factors:Diabetes and Dyslipidemia.  Sonographer:    Tiffany Dance Referring Phys: [id number] CHING T TU IMPRESSIONS  1. Left ventricular ejection fraction, by estimation, is 20%. The left ventricle has severely decreased function. The left ventricle demonstrates global hypokinesis. Left ventricular diastolic parameters are consistent with Grade III diastolic dysfunction (restrictive).  2. Right ventricular systolic function is mildly reduced. The right ventricular size is mildly enlarged. There is moderately elevated pulmonary artery systolic pressure. The estimated right ventricular systolic pressure is 53.9 mmHg.  3. Left atrial size was moderately dilated.  4. Right atrial size was  moderately dilated.  5. The mitral valve is degenerative. Moderate to severe mitral valve regurgitation. No evidence of mitral stenosis.  6. Tricuspid valve regurgitation is severe.  7. The aortic valve is tricuspid. Aortic valve regurgitation is not visualized. No aortic stenosis is present.  8. The inferior vena cava is dilated in size with <50% respiratory variability, suggesting right atrial pressure of 15 mmHg. FINDINGS  Left Ventricle: Left ventricular ejection fraction, by estimation, is 20%. The left ventricle has severely decreased function. The left ventricle demonstrates global hypokinesis. The left ventricular internal cavity size was normal in size. There is no left ventricular hypertrophy. Left ventricular diastolic parameters are consistent with Grade III diastolic dysfunction (restrictive). Right Ventricle: The right ventricular size is mildly enlarged. No increase in right ventricular wall thickness. Right ventricular systolic function is mildly reduced. There is moderately elevated pulmonary artery systolic pressure. The tricuspid regurgitant velocity is 3.12 m/s, and with an assumed right atrial pressure of 15 mmHg, the estimated right ventricular systolic pressure is 53.9 mmHg. Left Atrium: Left atrial size was moderately dilated. Right Atrium: Right atrial size was moderately dilated. Pericardium: Trivial pericardial effusion is present. Mitral Valve: The mitral valve is degenerative in appearance. Moderate mitral annular calcification. Moderate to severe mitral valve regurgitation. No evidence of mitral valve stenosis. Tricuspid Valve: The tricuspid valve is grossly normal. Tricuspid valve regurgitation is severe. Aortic Valve: The aortic valve is tricuspid. Aortic valve regurgitation is not visualized. No aortic  stenosis is present. Pulmonic Valve: The pulmonic valve was grossly normal. Pulmonic valve regurgitation is trivial. Aorta: The aortic root and ascending aorta are structurally normal,  with no evidence of dilitation. Venous: The inferior vena cava is dilated in size with less than 50% respiratory variability, suggesting right atrial pressure of 15 mmHg. IAS/Shunts: No atrial level shunt detected by color flow Doppler.  LEFT VENTRICLE PLAX 2D LVIDd:         4.60 cm  Diastology LVIDs:         4.20 cm  LV e' lateral:   6.53 cm/s LV PW:         0.90 cm  LV E/e' lateral: 18.2 LV IVS:        0.90 cm LVOT diam:     2.10 cm LV SV:         24 LV SV Index:   18 LVOT Area:     3.46 cm  RIGHT VENTRICLE            IVC RV Basal diam:  3.20 cm    IVC diam: 2.60 cm RV Mid diam:    2.20 cm RV S prime:     7.94 cm/s TAPSE (M-mode): 1.3 cm LEFT ATRIUM             Index       RIGHT ATRIUM           Index LA diam:        3.90 cm 2.94 cm/m  RA Area:     18.80 cm LA Vol (A2C):   61.8 ml 46.60 ml/m RA Volume:   52.00 ml  39.21 ml/m LA Vol (A4C):   47.6 ml 35.89 ml/m LA Biplane Vol: 55.0 ml 41.47 ml/m  AORTIC VALVE LVOT Vmax:   45.40 cm/s LVOT Vmean:  30.700 cm/s LVOT VTI:    0.069 m  AORTA Ao Root diam: 2.80 cm Ao Asc diam:  2.95 cm MITRAL VALVE                TRICUSPID VALVE MV Area (PHT): 5.38 cm     TR Peak grad:   38.9 mmHg MV Decel Time: 141 msec     TR Vmax:        312.00 cm/s MV E velocity: 119.00 cm/s MV A velocity: 59.60 cm/s   SHUNTS MV E/A ratio:  2.00         Systemic VTI:  0.07 m                             Systemic Diam: 2.10 cm Weston Brass MD Electronically signed by Weston Brass MD Signature Date/Time: 05/21/2020/7:35:21 PM    Final     Assessment and Plan:   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 She has had poor urine output recorded although patient says she has been frequently  urinating  - strict I/Os, daily weights, monitor creatinine with diuresis -  Need co-ox and repeat lactic acid.  - will need guideline directed therapy for CHF, however might be difficult with low pressures.  - She is volume up on exam. continue Ted hose and diuresis - Patient will require cath for  new low EF, likely R&L. This will help direct diuretic management. Creatinine normal. Md to see Risks and benefits of cardiac catheterization have been discussed with the patient.  These include bleeding, infection, kidney damage, stroke, heart attack, death.  The patient understands these risks and is willing to proceed.  Elevated troponin - HS 33>45>43 - troponin could be demand ischemia however require cath given low EF - do not see prior ischemic work-up - RF include HTN, HLD, DM2  HLD - pta rosuvastatin 10 mg daily>>increased on admission  Stenosis of prox SMA - No abd symptoms - increase statin dose - per IM  NSVT - 5 beats on tele - wait on BB given acute CHF  DM2 - A1C 9.9 - per primary team  For questions or updates, please contact [facility] Please consult [email or web address] for contact info under    Signed, Tranesha Lessner David StallH Nikholas Geffre, PA-C  05/22/2020 10:05 AM

## 2020-05-22 NOTE — Consult Note (Addendum)
Advanced Heart Failure Team Consult Note   Primary Physician: Cheron Schaumann., MD PCP-Cardiologist:  Jodelle Red, MD  Reason for Consultation: A/C Diastolic Heart Failure   HPI:    Shermeka Rutt is seen today for evaluation of A/C diastolic heart failure at the request of Dr Jenkins Rouge.   Ms Mruk is a 79 year old with history of  DMII, psoriasis, HLD, RCEA 20 years ago, and tobacco abuse. Smokes 1 pack of cigarettes per week. No previous MI. Treated for pneumonia 2 months ago.   Prior to admit she was SOB with steps and rarely left her house. About 30 days ago she had what she thought was a panic attack.  Her son lives with her and has noticed some confusion.    Presented to [facility] 05/20/20 with increased dyspnea and leg edema. Recently treated for pneumonia. BNP 3975, K 3.3, Hgb 11.1. CT head negative. CT abd/pelvis with small pleural effusions. CXR with suspected underlying interstitial fibrosis. Echo completed and showed EF 20% with dilated LA, RA, mitral valve degenerative, and mod-severe MR.   Transferred to [facility] for cardiology consult/heart cath. Had cath today with multivessel disease 80% calcified ostial/proximal left main stenosis with pressure dampening; calcified LAD with 55 to 60% mid stenosis; 20 and 50% proximal circumflex stenoses with 70% AV groove stenosis; dominant RCA with 30% diffuse irregularity in the mid vessel with 70% stenosis proximal to the acute margin. PA sat 40%. Started on milrinone 0.   Lactic Acid 2.4>2.6> 3.8   RHC/LHC 05/22/20   RA 8, PWP 19, PA 40/18 (25), Pa Sat 40%. CO 2.3 CI 1.7   Ost LM lesion is 80% stenosed.  Mid RCA lesion is 70% stenosed.  Prox RCA lesion is 30% stenosed.  Ost Cx lesion is 20% stenosed.  Prox Cx lesion is 50% stenosed.  1st Mrg lesion is 70% stenosed.  Mid LAD lesion is 60% stenosed.  Mid Cx lesion is 20% stenosed.  Review of Systems: [y] = yes, [ ]  = no   . General: Weight gain [  ]; Weight loss [ ] ; Anorexia [ ] ; Fatigue [ Y]; Fever [ ] ; Chills [ ] ; Weakness [Y ]  . Cardiac: Chest pain/pressure [ ] ; Resting SOB [ ] ; Exertional SOB [Y ]; Orthopnea [ ] ; Pedal Edema [ ] ; Palpitations [ ] ; Syncope [ ] ; Presyncope [ ] ; Paroxysmal nocturnal dyspnea[ ]   . Pulmonary: Cough [ ] ; Wheezing[ ] ; Hemoptysis[ ] ; Sputum [ ] ; Snoring [ ]   . GI: Vomiting[ ] ; Dysphagia[ ] ; Melena[ ] ; Hematochezia [ ] ; Heartburn[ ] ; Abdominal pain [ ] ; Constipation [ ] ; Diarrhea [ ] ; BRBPR [ ]   . GU: Hematuria[ ] ; Dysuria [ ] ; Nocturia[ ]   . Vascular: Pain in legs with walking [ ] ; Pain in feet with lying flat [ ] ; Non-healing sores [ ] ; Stroke [ ] ; TIA [ ] ; Slurred speech [ ] ;  . Neuro: Headaches[ ] ; Vertigo[ ] ; Seizures[ ] ; Paresthesias[ ] ;Blurred vision [ ] ; Diplopia [ ] ; Vision changes [ ]   . Ortho/Skin: Arthritis [ ] ; Joint pain [ Y]; Muscle pain [ ] ; Joint swelling [ ] ; Back Pain [ ] ; Rash [ ]   . Psych: Depression[ ] ; Anxiety[Y  . Heme: Bleeding problems [ ] ; Clotting disorders [ ] ; Anemia [Y ]  . Endocrine: Diabetes [Y ]; Thyroid dysfunction[ ]   Home Medications Prior to Admission medications   Medication Sig Start Date End Date Taking? Authorizing Provider  gabapentin (NEURONTIN) 100 MG capsule Take 300 mg by  mouth at bedtime.  05/18/18  Yes [provider]  glipiZIDE (GLUCOTROL) 5 MG tablet Take 5 mg by mouth daily.  01/03/19  Yes [provider]  metFORMIN (GLUCOPHAGE) 1000 MG tablet Take 1,000 mg by mouth 2 (two) times daily with a meal.   Yes [provider]  rosuvastatin (CRESTOR) 10 MG tablet Take 10 mg by mouth at bedtime. 01/28/20  Yes [provider]  sertraline (ZOLOFT) 50 MG tablet Take 50 mg by mouth daily. 01/27/20  Yes [provider]    Past Medical History: Past Medical History:  Diagnosis Date  . Depression   . Diabetes mellitus without complication (HCC)   . Hyperlipidemia   . Psoriasis     Past Surgical History: Past Surgical  History:  Procedure Laterality Date  . ABDOMINAL HYSTERECTOMY    . CAROTID ARTERY ANGIOPLASTY    . HEMORROIDECTOMY      Family History: History reviewed. No pertinent family history.  Social History: Social History   Socioeconomic History  . Marital status: Widowed    Spouse name: Not on file  . Number of children: Not on file  . Years of education: Not on file  . Highest education level: Not on file  Occupational History  . Not on file  Tobacco Use  . Smoking status: Current Every Day Smoker    Types: Cigarettes  . Smokeless tobacco: Never Used  Vaping Use  . Vaping Use: Never used  Substance and Sexual Activity  . Alcohol use: Never  . Drug use: Never  . Sexual activity: Not on file  Other Topics Concern  . Not on file  Social History Narrative  . Not on file   Social Determinants of Health   Financial Resource Strain:   . Difficulty of Paying Living Expenses: Not on file  Food Insecurity:   . Worried About Programme researcher, broadcasting/film/videounning Out of Food in the Last Year: Not on file  . Ran Out of Food in the Last Year: Not on file  Transportation Needs:   . Lack of Transportation (Medical): Not on file  . Lack of Transportation (Non-Medical): Not on file  Physical Activity:   . Days of Exercise per Week: Not on file  . Minutes of Exercise per Session: Not on file  Stress:   . Feeling of Stress : Not on file  Social Connections:   . Frequency of Communication with Friends and Family: Not on file  . Frequency of Social Gatherings with Friends and Family: Not on file  . Attends Religious Services: Not on file  . Active Member of Clubs or Organizations: Not on file  . Attends BankerClub or Organization Meetings: Not on file  . Marital Status: Not on file    Allergies:  Allergies  Allergen Reactions  . Losartan Rash    erythroderma  . Sulfa Antibiotics Itching and Rash    Objective:    Vital Signs:   Temp:  [97.7 F (36.5 C)-98.6 F (37 C)] 98.6 F (37 C) (08/27 0447) Pulse  Rate:  [90-102] 90 (08/27 1500) Resp:  [15-18] 18 (08/27 1500) BP: (106-125)/(53-63) 125/57 (08/27 1500) SpO2:  [97 %-100 %] 100 % (08/27 1500) Weight:  [43.2 kg] 43.2 kg (08/27 0447) Last BM Date: 05/21/20  Weight change: Filed Weights   05/20/20 2104 05/21/20 0500 05/22/20 0447  Weight: 43 kg 43 kg 43.2 kg    Intake/Output:   Intake/Output Summary (Last 24 hours) at 05/22/2020 1517 Last data filed at 05/22/2020 1300 Gross per  24 hour  Intake 340 ml  Output 200 ml  Net 140 ml      Physical Exam    General:  Elderly,  No resp difficulty HEENT: normal Neck: supple. JVP 8-9 . Carotids 2+ bilat; no bruits. No lymphadenopathy or thyromegaly appreciated. Cor: PMI nondisplaced. Regular rate & rhythm. No rubs, gallops or murmurs. Lungs: clear Abdomen: soft, nontender, nondistended. No hepatosplenomegaly. No bruits or masses. Good bowel sounds. Extremities: cool, no cyanosis, clubbing, rash, R and LLE 1+ edema Neuro: alert & orientedx3, cranial nerves grossly intact. moves all 4 extremities w/o difficulty. Affect pleasant   Telemetry  SR 80s    EKG      Labs   Basic Metabolic Panel: Recent Labs  Lab 05/20/20 1036 05/21/20 0240 05/22/20 0307  NA 135 136 131*  K 3.3* 4.3 3.4*  CL 99 101 98  CO2 GLUCOSE 283* 133* 255*  BUN CREATININE 0.86 1.00 0.93  CALCIUM 8.6* 8.3* 8.0*  MG  --   --  1.1*    Liver Function Tests: Recent Labs  Lab 05/20/20 1036  AST 20  ALT 16  ALKPHOS 97  BILITOT 1.1  PROT 6.8  ALBUMIN 3.6   Recent Labs  Lab 05/20/20 1036  LIPASE 24   No results for input(s): AMMONIA in the last 168 hours.  CBC: Recent Labs  Lab 05/20/20 1036 05/21/20 0240 05/22/20 0307  WBC 6.6 6.1 6.7  NEUTROABS 5.4  --   --   HGB 11.1* 9.8* 9.7*  HCT 37.7 33.5* 32.0*  MCV 81.1 80.9 79.6*  PLT 367 324 308    Cardiac Enzymes: No results for input(s): CKTOTAL, CKMB, CKMBINDEX, TROPONINI in the last 168 hours.  BNP: BNP (last 3  results) Recent Labs    05/20/20 1036  BNP 3,975.8*    ProBNP (last 3 results) No results for input(s): PROBNP in the last 8760 hours.   CBG: Recent Labs  Lab 05/21/20 0634 05/21/20 1724 05/21/20 2114 05/22/20 0701 05/22/20 1111  GLUCAP 175* 162* 175* 227* 290*    Coagulation Studies: No results for input(s): LABPROT, INR in the last 72 hours.   Imaging   ECHOCARDIOGRAM COMPLETE  Result Date: 05/21/2020    ECHOCARDIOGRAM REPORT   Patient Name:   Jillian Webb Date of Exam: 05/21/2020 Medical Rec #:  [id number]        Height:       58.0 in Accession #:    [id number]       Weight:       94.8 lb Date of Birth:  [birth year]        BSA:          1.326 m Patient Age:    79 years         BP:           128/73 mmHg Patient Gender: F                HR:           97 bpm. Exam Location:  Inpatient Procedure: 2D Echo, Cardiac Doppler and Color Doppler Indications:    I50.23 Acute on chronic systolic (congestive) heart failure  History:        Patient has no prior history of Echocardiogram examinations.                 Risk Factors:Diabetes and Dyslipidemia.  Sonographer:    Tiffany Dance Referring Phys: [id number] CHING T TU  IMPRESSIONS  1. Left ventricular ejection fraction, by estimation, is 20%. The left ventricle has severely decreased function. The left ventricle demonstrates global hypokinesis. Left ventricular diastolic parameters are consistent with Grade III diastolic dysfunction (restrictive).  2. Right ventricular systolic function is mildly reduced. The right ventricular size is mildly enlarged. There is moderately elevated pulmonary artery systolic pressure. The estimated right ventricular systolic pressure is 53.9 mmHg.  3. Left atrial size was moderately dilated.  4. Right atrial size was moderately dilated.  5. The mitral valve is degenerative. Moderate to severe mitral valve regurgitation. No evidence of mitral stenosis.  6. Tricuspid valve regurgitation is severe.  7. The aortic valve  is tricuspid. Aortic valve regurgitation is not visualized. No aortic stenosis is present.  8. The inferior vena cava is dilated in size with <50% respiratory variability, suggesting right atrial pressure of 15 mmHg. FINDINGS  Left Ventricle: Left ventricular ejection fraction, by estimation, is 20%. The left ventricle has severely decreased function. The left ventricle demonstrates global hypokinesis. The left ventricular internal cavity size was normal in size. There is no left ventricular hypertrophy. Left ventricular diastolic parameters are consistent with Grade III diastolic dysfunction (restrictive). Right Ventricle: The right ventricular size is mildly enlarged. No increase in right ventricular wall thickness. Right ventricular systolic function is mildly reduced. There is moderately elevated pulmonary artery systolic pressure. The tricuspid regurgitant velocity is 3.12 m/s, and with an assumed right atrial pressure of 15 mmHg, the estimated right ventricular systolic pressure is 53.9 mmHg. Left Atrium: Left atrial size was moderately dilated. Right Atrium: Right atrial size was moderately dilated. Pericardium: Trivial pericardial effusion is present. Mitral Valve: The mitral valve is degenerative in appearance. Moderate mitral annular calcification. Moderate to severe mitral valve regurgitation. No evidence of mitral valve stenosis. Tricuspid Valve: The tricuspid valve is grossly normal. Tricuspid valve regurgitation is severe. Aortic Valve: The aortic valve is tricuspid. Aortic valve regurgitation is not visualized. No aortic stenosis is present. Pulmonic Valve: The pulmonic valve was grossly normal. Pulmonic valve regurgitation is trivial. Aorta: The aortic root and ascending aorta are structurally normal, with no evidence of dilitation. Venous: The inferior vena cava is dilated in size with less than 50% respiratory variability, suggesting right atrial pressure of 15 mmHg. IAS/Shunts: No atrial level  shunt detected by color flow Doppler.  LEFT VENTRICLE PLAX 2D LVIDd:         4.60 cm  Diastology LVIDs:         4.20 cm  LV e' lateral:   6.53 cm/s LV PW:         0.90 cm  LV E/e' lateral: 18.2 LV IVS:        0.90 cm LVOT diam:     2.10 cm LV SV:         24 LV SV Index:   18 LVOT Area:     3.46 cm  RIGHT VENTRICLE            IVC RV Basal diam:  3.20 cm    IVC diam: 2.60 cm RV Mid diam:    2.20 cm RV S prime:     7.94 cm/s TAPSE (M-mode): 1.3 cm LEFT ATRIUM             Index       RIGHT ATRIUM           Index LA diam:        3.90 cm 2.94 cm/m  RA Area:  18.80 cm LA Vol (A2C):   61.8 ml 46.60 ml/m RA Volume:   52.00 ml  39.21 ml/m LA Vol (A4C):   47.6 ml 35.89 ml/m LA Biplane Vol: 55.0 ml 41.47 ml/m  AORTIC VALVE LVOT Vmax:   45.40 cm/s LVOT Vmean:  30.700 cm/s LVOT VTI:    0.069 m  AORTA Ao Root diam: 2.80 cm Ao Asc diam:  2.95 cm MITRAL VALVE                TRICUSPID VALVE MV Area (PHT): 5.38 cm     TR Peak grad:   38.9 mmHg MV Decel Time: 141 msec     TR Vmax:        312.00 cm/s MV E velocity: 119.00 cm/s MV A velocity: 59.60 cm/s   SHUNTS MV E/A ratio:  2.00         Systemic VTI:  0.07 m                             Systemic Diam: 2.10 cm Weston Brass MD Electronically signed by Weston Brass MD Signature Date/Time: 05/21/2020/7:35:21 PM    Final       Medications:     Current Medications: . [MAR Hold] (feeding supplement) PROSource Plus  30 mL Oral BID BM  . [START ON 05/23/2020] aspirin  81 mg Oral Pre-Cath  . [MAR Hold] cyanocobalamin  1,000 mcg Intramuscular Daily  . [MAR Hold] enoxaparin (LOVENOX) injection  40 mg Subcutaneous Q24H  . [MAR Hold] furosemide  20 mg Intravenous BID  . [MAR Hold] insulin aspart  0-5 Units Subcutaneous QHS  . [MAR Hold] insulin aspart  0-9 Units Subcutaneous TID WC  . [MAR Hold] multivitamin with minerals  1 tablet Oral Daily  . potassium chloride  40 mEq Oral Q4H  . [MAR Hold] pravastatin  80 mg Oral q1800  . [MAR Hold] sertraline  50 mg Oral Daily   . [MAR Hold] sodium chloride flush  3 mL Intravenous Q12H  . [MAR Hold] sodium chloride flush  3 mL Intravenous Q12H     Infusions: . [MAR Hold] sodium chloride          Assessment/Plan   1. Acute Systolic HF-->Cardiogenic Shock , ICM, CAD - Lactic Acid 2.4>2.6>3.8  - ECHO 04/2020 EF 20%  - RHC /LHC with severe multiple vessel disease, low PA sat, cardiac output. CT surgery consulted. - Limited mobility prior to admit.  - Volume status mildly elevated. Give a couple more doses of IV lasix.  - Would not place on bb with shock  - Add digoxin 0.125 mg daily  - Follow renal function  - Suspect she will need palliative Care.   2.  DMII 3.  Smoker  4. DNR/DNI     Length of Stay: 2  Tonye Becket, NP  05/22/2020, 3:17 PM  Advanced Heart Failure Team Pager [phone] (M-F; 7a - 4p)  Please contact [facility] for night-coverage after hours (4p -7a ) and weekends on [email or web address]  Patient seen and examined with the above-signed Advanced Practice Provider and/or Housestaff. I personally reviewed laboratory data, imaging studies and relevant notes. I independently examined the patient and formulated the important aspects of the plan. I have edited the note to reflect any of my changes or salient points. I have personally discussed the plan with the patient and/or family.  79 y/o woman with COPD and ongoing tobacco use, mild dementia, cachexia whom we are  asked to see by Dr. Cristal Deer due to cardiogenic shock in setting of new onset systolic HF and ischemic heart disease.  Denies any h/o known heart disease. Husband died 14 years ago. Lives at her house with her son. Minimally active. Still smoking. Has been losing weight but can't provide me details.  1 month h/o worsening SOB but tells me it is hard for her to ell if it is her heart because she also has panic attacks.   Admitted with HF. Echo with severe biventricular failure EF 20%. Cath with 80% ostial LM otherwise normal  arteries. RHC with PA sat 40%. CI 1.7   On exam General:  Cachetic frail elderly woman lying flat in bed. Mild confusion  HEENT: normal Neck: supple. JVP to jaw Carotids 2+ bilat; no bruits. No lymphadenopathy or thryomegaly appreciated. Cor: PMI nondisplaced. Regular rate & rhythm. 2/6 MR Lungs: decreased throughout Abdomen: soft, nontender, nondistended. No hepatosplenomegaly. No bruits or masses. Good bowel sounds. Extremities: no cyanosis, clubbing, rash, edema Neuro: alert & orientedx2 (thinks she is in a school), cranial nerves grossly intact. moves all 4 extremities w/o difficulty. Affect pleasant  I have reviewed cath and echo films personally. She has a high-grade ostial LM disease but nor sure this explains all of her cardiomyopathy. She has severe biventricular dysfunction by echo and cardiogenic shock physiology on RHC.   Given her comorbidities and her cachexia/faility, she is not a candidate for advanced therapies and I think Palliative Care/Hospice is really the best option. She tells me she is already DNR. In the meantime can start gentle HF therapies and see how she responds.   The AHF team will sign off. Please call if there are questions.   Arvilla Meres, MD  4:40 PM

## 2020-05-22 NOTE — Plan of Care (Signed)
  Problem: Activity: Goal: Capacity to carry out activities will improve 05/22/2020 2220 by Fulton Reek, RN Outcome: Progressing 05/22/2020 2219 by Fulton Reek, RN Outcome: Progressing   Problem: Education: Goal: Knowledge of General Education information will improve Description: Including pain rating scale, medication(s)/side effects and non-pharmacologic comfort measures Outcome: Progressing   Problem: Clinical Measurements: Goal: Ability to maintain clinical measurements within normal limits will improve Outcome: Progressing   Problem: Clinical Measurements: Goal: Diagnostic test results will improve Outcome: Progressing   Problem: Clinical Measurements: Goal: Cardiovascular complication will be avoided Outcome: Progressing   Problem: Nutrition: Goal: Adequate nutrition will be maintained Outcome: Progressing   Problem: Pain Managment: Goal: General experience of comfort will improve Outcome: Progressing   Problem: Skin Integrity: Goal: Risk for impaired skin integrity will decrease Outcome: Progressing

## 2020-05-22 NOTE — Plan of Care (Signed)
?  Problem: Activity: ?Goal: Capacity to carry out activities will improve ?Outcome: Progressing ?  ?

## 2020-05-22 NOTE — H&P (View-Only) (Signed)
Cardiology Consultation:   Patient ID: Jillian Webb MRN: [id number]; DOB: 09/08/1941  Admit date: 05/20/2020 Date of Consult: 05/22/2020  Primary Care Provider: Velazquez, Gretchen Y., MD [facility] Cardiologist:New [facility] Electrophysiologist:  None    Patient Profile:   Jillian Webb is a 79 y.o. female with a hx of DM2, psoriasis, HLD, R carotid endarterectomy (20 years ago)  who is being seen today for the evaluation of CHF at the request of Dr. Regalado.  History of Present Illness:   [name] has no prior history of MI or stent. Says she has not seen cardiology in the past. No cath or stress test in prior records. She has history of hyperlipidemia on statin. Reported R carotid endarterectomy 20 years ago. She was not having symptoms at that time. She has long history of rare psoriasis. She is a lifelong smoker. No alcohol or drug use. She lives were her son however is able to perform all ADLs. She does not sleep well at night which attributes to anxiety. About 2 months ago the patient was treated for pneumonia. At that time patient noted to have some minor leg edema.   The patient presented to [facility] ED 05/20/20 for lower leg edema and mild sob. Her son also noted some confusion. LLEstarted a couple months ago and has been progressive. Her son noticed lower leg swelling 3 weeks ago. Patient reported mild sob on exertion. Overall though she has been feeling generally weak and tired since bought of pneumonia. She has not had energy to do things as before. Denies chest pain, palpitations, orthopnea, PND. No fever, chills. Says she felt sick about a week ago, stomach upset. Appetite has recently been low.   In the ED BP 115/65, pulse 103, afebrile, RR 23, 99% O2. JVD noted on exam as well as LLE. Labs showed Hgb 11.1, potassium 3.3, glucose 283, calcium 8.6. BNP 3,975, UA with protein, LA 2.6. HS troponin 33. CT head negative for acute process. CXR with interstitial fibrosis.  CT abdomen/pelvis noted small pleural effusions, small ascites, severe stenosis of prox SMA, adrenal lesion, aortic atherosclerosis and emphysema. Patient was transferred to [facility] for admission and further work-up.   On my exam the patient is alert and oriented x 3. Denies baseline dementia. Son and patient feel the confusion started when the pneumonia started and has been intermittent. No chest pain. Still volume up and has intermittent soft pressures.   Echo: LVEF 20%, G3DD, mod dilated La and RA, mod MR, severe TR   Past Medical History:  Diagnosis Date  . Depression   . Diabetes mellitus without complication (HCC)   . Hyperlipidemia   . Psoriasis     Past Surgical History:  Procedure Laterality Date  . ABDOMINAL HYSTERECTOMY    . CAROTID ARTERY ANGIOPLASTY    . HEMORROIDECTOMY       Home Medications:  Prior to Admission medications   Medication Sig Start Date End Date Taking? Authorizing Provider  gabapentin (NEURONTIN) 100 MG capsule Take 300 mg by mouth at bedtime.  05/18/18  Yes [provider]  glipiZIDE (GLUCOTROL) 5 MG tablet Take 5 mg by mouth daily.  01/03/19  Yes [provider]  metFORMIN (GLUCOPHAGE) 1000 MG tablet Take 1,000 mg by mouth 2 (two) times daily with a meal.   Yes [provider]  rosuvastatin (CRESTOR) 10 MG tablet Take 10 mg by mouth at bedtime. 01/28/20  Yes [provider]  sertraline (ZOLOFT) 50 MG tablet Take 50 mg by   mouth daily. 01/27/20  Yes [provider]    Inpatient Medications: Scheduled Meds: . (feeding supplement) PROSource Plus  30 mL Oral BID BM  . cyanocobalamin  1,000 mcg Intramuscular Daily  . enoxaparin (LOVENOX) injection  40 mg Subcutaneous Q24H  . furosemide  20 mg Intravenous BID  . insulin aspart  0-5 Units Subcutaneous QHS  . insulin aspart  0-9 Units Subcutaneous TID WC  . multivitamin with minerals  1 tablet Oral Daily  . potassium chloride  40 mEq Oral Q4H  . pravastatin  80 mg Oral  q1800  . sodium chloride flush  3 mL Intravenous Q12H   Continuous Infusions: . sodium chloride    . magnesium sulfate bolus IVPB     PRN Meds: sodium chloride, acetaminophen, ondansetron (ZOFRAN) IV, sodium chloride flush  Allergies:    Allergies  Allergen Reactions  . Losartan Rash    erythroderma  . Sulfa Antibiotics Itching and Rash    Social History:   Social History   Socioeconomic History  . Marital status: Widowed    Spouse name: Not on file  . Number of children: Not on file  . Years of education: Not on file  . Highest education level: Not on file  Occupational History  . Not on file  Tobacco Use  . Smoking status: Current Every Day Smoker    Types: Cigarettes  . Smokeless tobacco: Never Used  Vaping Use  . Vaping Use: Never used  Substance and Sexual Activity  . Alcohol use: Never  . Drug use: Never  . Sexual activity: Not on file  Other Topics Concern  . Not on file  Social History Narrative  . Not on file   Social Determinants of Health   Financial Resource Strain:   . Difficulty of Paying Living Expenses: Not on file  Food Insecurity:   . Worried About Running Out of Food in the Last Year: Not on file  . Ran Out of Food in the Last Year: Not on file  Transportation Needs:   . Lack of Transportation (Medical): Not on file  . Lack of Transportation (Non-Medical): Not on file  Physical Activity:   . Days of Exercise per Week: Not on file  . Minutes of Exercise per Session: Not on file  Stress:   . Feeling of Stress : Not on file  Social Connections:   . Frequency of Communication with Friends and Family: Not on file  . Frequency of Social Gatherings with Friends and Family: Not on file  . Attends Religious Services: Not on file  . Active Member of Clubs or Organizations: Not on file  . Attends Club or Organization Meetings: Not on file  . Marital Status: Not on file  Intimate Partner Violence:   . Fear of Current or Ex-Partner: Not on file   . Emotionally Abused: Not on file  . Physically Abused: Not on file  . Sexually Abused: Not on file    Family History:   History reviewed. No pertinent family history.   ROS:  Please see the history of present illness.  All other ROS reviewed and negative.     Physical Exam/Data:   Vitals:   05/21/20 0914 05/21/20 1639 05/21/20 2001 05/22/20 0447  BP: 128/73 (!) 108/54 (!) 106/53 122/63  Pulse: 100 96 95 (!) 102  Resp: 18 18 15 16  Temp: 97.6 F (36.4 C) 97.7 F (36.5 C) 98.1 F (36.7 C) 98.6 F (37 C)  TempSrc: Oral Oral Oral   Oral  SpO2: 98% 99% 97% 98%  Weight:    43.2 kg    Intake/Output Summary (Last 24 hours) at 05/22/2020 1005 Last data filed at 05/22/2020 0600 Gross per 24 hour  Intake 220 ml  Output --  Net 220 ml   Last 3 Weights 05/22/2020 05/21/2020 05/20/2020  Weight (lbs) 95 lb 3.8 oz 94 lb 12.8 oz 94 lb 12.8 oz  Weight (kg) 43.2 kg 43 kg 43 kg     Body mass index is 18.6 kg/m.  General:  Well nourished, well developed, in no acute distress HEENT: normal Lymph: no adenopathy Neck: + JVD Endocrine:  No thryomegaly Vascular: +L carotid bruits; FA pulses 2+ bilaterally without bruits  Cardiac:  normal S1, S2; RRR; + murmur  Lungs:  clear to auscultation bilaterally, no wheezing, rhonchi or rales  Abd: soft, nontender, no hepatomegaly  Ext: B/L edema Musculoskeletal:  No deformities, BUE and BLE strength normal and equal Skin: warm and dry  Neuro:  CNs 2-12 intact, no focal abnormalities noted Psych:  Normal affect   EKG:  The EKG was personally reviewed and demonstrates:  Sinus tach, 109 bpm, TWI lateral leads, q waves V1-V2, nonspecific ST changes Telemetry:  Telemetry was personally reviewed and demonstrates:  NSR, HR 90-100, PVCs, 5bNSVT  Relevant CV Studies:  Echo 05/21/20 1. Left ventricular ejection fraction, by estimation, is 20%. The left  ventricle has severely decreased function. The left ventricle demonstrates  global hypokinesis. Left  ventricular diastolic parameters are consistent  with Grade III diastolic  dysfunction (restrictive).  2. Right ventricular systolic function is mildly reduced. The right  ventricular size is mildly enlarged. There is moderately elevated  pulmonary artery systolic pressure. The estimated right ventricular  systolic pressure is 53.9 mmHg.  3. Left atrial size was moderately dilated.  4. Right atrial size was moderately dilated.  5. The mitral valve is degenerative. Moderate to severe mitral valve  regurgitation. No evidence of mitral stenosis.  6. Tricuspid valve regurgitation is severe.  7. The aortic valve is tricuspid. Aortic valve regurgitation is not  visualized. No aortic stenosis is present.  8. The inferior vena cava is dilated in size with <50% respiratory  variability, suggesting right atrial pressure of 15 mmHg.   Laboratory Data:  High Sensitivity Troponin:   Recent Labs  Lab 05/20/20 1036 05/20/20 2119 05/20/20 2314  TROPONINIHS 33* 45* 43*     Chemistry Recent Labs  Lab 05/20/20 1036 05/21/20 0240 05/22/20 0307  NA 135 136 131*  K 3.3* 4.3 3.4*  CL 99 101 98  CO2 23 23 22  GLUCOSE 283* 133* 255*  BUN 16 16 19  CREATININE 0.86 1.00 0.93  CALCIUM 8.6* 8.3* 8.0*  GFRNONAA >60 54* 58*  GFRAA >60 >60 >60  ANIONGAP 13 12 11    Recent Labs  Lab 05/20/20 1036  PROT 6.8  ALBUMIN 3.6  AST 20  ALT 16  ALKPHOS 97  BILITOT 1.1   Hematology Recent Labs  Lab 05/20/20 1036 05/21/20 0240 05/22/20 0307  WBC 6.6 6.1 6.7  RBC 4.65 4.14 4.02  HGB 11.1* 9.8* 9.7*  HCT 37.7 33.5* 32.0*  MCV 81.1 80.9 79.6*  MCH 23.9* 23.7* 24.1*  MCHC 29.4* 29.3* 30.3  RDW 18.6* 18.5* 18.3*  PLT 367 324 308   BNP Recent Labs  Lab 05/20/20 1036  BNP 3,975.8*    DDimer No results for input(s): DDIMER in the last 168 hours.   Radiology/Studies:  DG Chest 2 View    Result Date: 05/20/2020 CLINICAL DATA:  Fatigue with lower extremity edema. EXAM: CHEST - 2 VIEW  COMPARISON:  May 04, 2020 FINDINGS: There is underlying interstitial thickening, likely fibrotic change. There is no appreciable edema or consolidation. The heart is upper normal in size with pulmonary vascularity normal. Central peribronchial thickening noted. There is aortic atherosclerosis. No adenopathy. There are calcified breast implants bilaterally. Bones appear osteoporotic. There are surgical clips in the right neck region. There is calcification in each axillary artery. IMPRESSION: Suspected underlying interstitial fibrosis. No edema or airspace opacity. Heart upper normal in size. No adenopathy appreciable. Calcified breast implants. Aortic Atherosclerosis (ICD10-I70.0). Axillary artery calcification bilaterally also noted. Bones appear osteoporotic. Electronically Signed   By: William  Woodruff III M.D.   On: 05/20/2020 11:45   CT Head Wo Contrast  Result Date: 05/20/2020 CLINICAL DATA:  Delirium/altered mental status EXAM: CT HEAD WITHOUT CONTRAST TECHNIQUE: Contiguous axial images were obtained from the base of the skull through the vertex without intravenous contrast. COMPARISON:  None. FINDINGS: Brain: There is moderate diffuse atrophy. There is no intracranial mass, hemorrhage extra-axial fluid collection, or midline shift. There is patchy small vessel disease in the centra semiovale bilaterally. Small vessel disease is noted in the posterior limb of each internal capsule. No acute appearing infarct evident. Vascular: No hyperdense vessel. There is calcification in each carotid siphon and distal left vertebral artery region. Skull: The bony calvarium appears intact. Sinuses/Orbits: Visualized paranasal sinuses are clear. There is apparent cataract removal on the left. Orbits otherwise appear symmetric bilaterally. Other: Mastoid air cells are clear. IMPRESSION: Atrophy with patchy supratentorial small vessel disease. No acute infarct. No mass or hemorrhage. There are foci of arterial vascular  calcification. Electronically Signed   By: William  Woodruff III M.D.   On: 05/20/2020 11:42   CT ABDOMEN PELVIS W CONTRAST  Result Date: 05/20/2020 CLINICAL DATA:  Fatigue, leg swelling, difficulty eating and drinking the past 2 months. EXAM: CT ABDOMEN AND PELVIS WITH CONTRAST TECHNIQUE: Multidetector CT imaging of the abdomen and pelvis was performed using the standard protocol following bolus administration of intravenous contrast. CONTRAST:  100mL OMNIPAQUE IOHEXOL 300 MG/ML  SOLN COMPARISON:  Chest x-ray from same day. FINDINGS: Lower chest: Borderline cardiomegaly with enlarged right atrium. Small right greater than left pleural effusions. Centrilobular emphysema. Hepatobiliary: Subcentimeter low-density lesion in the right hepatic lobe is too small to characterize. No other focal liver abnormality. Somewhat contracted gallbladder with diffuse wall thickening. No gallstones or biliary dilatation. Pancreas: Unremarkable. No pancreatic ductal dilatation or surrounding inflammatory changes. Spleen: Normal in size without focal abnormality. Adrenals/Urinary Tract: 1.5 cm indeterminate lesion in the left adrenal gland. The right adrenal gland is unremarkable. No renal calculi, solid lesion, or hydronephrosis. The bladder is underdistended. Stomach/Bowel: Stomach is within normal limits. Appendix appears normal. No evidence of bowel wall thickening, distention, or inflammatory changes. Extensive colonic diverticulosis. Vascular/Lymphatic: Extensive aortoiliac atherosclerosis. 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 No enlarged abdominal or pelvic lymph nodes. Reproductive: Fibroid uterus. Some fibroids are calcified, others are cystically degenerated. No adnexal mass. Other: Small ascites.  No pneumoperitoneum. Musculoskeletal: No acute or significant osseous findings. Anasarca. IMPRESSION: 1. No acute intra-abdominal process. 2. Small right greater than left pleural effusions, small  ascites, and anasarca, consistent with volume overload. 3. 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 4. Indeterminate 1.5 cm left adrenal lesion. Consider follow-up adrenal protocol CT with and without contrast in 12 months. This recommendation follows ACR consensus   guidelines: Management of Incidental Adrenal Masses: A White Paper of the ACR Incidental Findings Committee. J Am Coll Radiol 2017;14:1038-1044. 5. Aortic Atherosclerosis (ICD10-I70.0) and Emphysema (ICD10-J43.9). Electronically Signed   By: William T Derry M.D.   On: 05/20/2020 12:53   ECHOCARDIOGRAM COMPLETE  Result Date: 05/21/2020    ECHOCARDIOGRAM REPORT   Patient Name:   Jalynn Chewning Date of Exam: 05/21/2020 Medical Rec #:  [id number]        Height:       58.0 in Accession #:    [id number]       Weight:       94.8 lb Date of Birth:  08/02/1941        BSA:          1.326 m Patient Age:    79 years         BP:           128/73 mmHg Patient Gender: F                HR:           97 bpm. Exam Location:  Inpatient Procedure: 2D Echo, Cardiac Doppler and Color Doppler Indications:    I50.23 Acute on chronic systolic (congestive) heart failure  History:        Patient has no prior history of Echocardiogram examinations.                 Risk Factors:Diabetes and Dyslipidemia.  Sonographer:    Tiffany Dance Referring Phys: [id number] CHING T TU IMPRESSIONS  1. Left ventricular ejection fraction, by estimation, is 20%. The left ventricle has severely decreased function. The left ventricle demonstrates global hypokinesis. Left ventricular diastolic parameters are consistent with Grade III diastolic dysfunction (restrictive).  2. Right ventricular systolic function is mildly reduced. The right ventricular size is mildly enlarged. There is moderately elevated pulmonary artery systolic pressure. The estimated right ventricular systolic pressure is 53.9 mmHg.  3. Left atrial size was moderately dilated.  4. Right atrial size was  moderately dilated.  5. The mitral valve is degenerative. Moderate to severe mitral valve regurgitation. No evidence of mitral stenosis.  6. Tricuspid valve regurgitation is severe.  7. The aortic valve is tricuspid. Aortic valve regurgitation is not visualized. No aortic stenosis is present.  8. The inferior vena cava is dilated in size with <50% respiratory variability, suggesting right atrial pressure of 15 mmHg. FINDINGS  Left Ventricle: Left ventricular ejection fraction, by estimation, is 20%. The left ventricle has severely decreased function. The left ventricle demonstrates global hypokinesis. The left ventricular internal cavity size was normal in size. There is no left ventricular hypertrophy. Left ventricular diastolic parameters are consistent with Grade III diastolic dysfunction (restrictive). Right Ventricle: The right ventricular size is mildly enlarged. No increase in right ventricular wall thickness. Right ventricular systolic function is mildly reduced. There is moderately elevated pulmonary artery systolic pressure. The tricuspid regurgitant velocity is 3.12 m/s, and with an assumed right atrial pressure of 15 mmHg, the estimated right ventricular systolic pressure is 53.9 mmHg. Left Atrium: Left atrial size was moderately dilated. Right Atrium: Right atrial size was moderately dilated. Pericardium: Trivial pericardial effusion is present. Mitral Valve: The mitral valve is degenerative in appearance. Moderate mitral annular calcification. Moderate to severe mitral valve regurgitation. No evidence of mitral valve stenosis. Tricuspid Valve: The tricuspid valve is grossly normal. Tricuspid valve regurgitation is severe. Aortic Valve: The aortic valve is tricuspid. Aortic valve regurgitation is not visualized. No aortic   stenosis is present. Pulmonic Valve: The pulmonic valve was grossly normal. Pulmonic valve regurgitation is trivial. Aorta: The aortic root and ascending aorta are structurally normal,  with no evidence of dilitation. Venous: The inferior vena cava is dilated in size with less than 50% respiratory variability, suggesting right atrial pressure of 15 mmHg. IAS/Shunts: No atrial level shunt detected by color flow Doppler.  LEFT VENTRICLE PLAX 2D LVIDd:         4.60 cm  Diastology LVIDs:         4.20 cm  LV e' lateral:   6.53 cm/s LV PW:         0.90 cm  LV E/e' lateral: 18.2 LV IVS:        0.90 cm LVOT diam:     2.10 cm LV SV:         24 LV SV Index:   18 LVOT Area:     3.46 cm  RIGHT VENTRICLE            IVC RV Basal diam:  3.20 cm    IVC diam: 2.60 cm RV Mid diam:    2.20 cm RV S prime:     7.94 cm/s TAPSE (M-mode): 1.3 cm LEFT ATRIUM             Index       RIGHT ATRIUM           Index LA diam:        3.90 cm 2.94 cm/m  RA Area:     18.80 cm LA Vol (A2C):   61.8 ml 46.60 ml/m RA Volume:   52.00 ml  39.21 ml/m LA Vol (A4C):   47.6 ml 35.89 ml/m LA Biplane Vol: 55.0 ml 41.47 ml/m  AORTIC VALVE LVOT Vmax:   45.40 cm/s LVOT Vmean:  30.700 cm/s LVOT VTI:    0.069 m  AORTA Ao Root diam: 2.80 cm Ao Asc diam:  2.95 cm MITRAL VALVE                TRICUSPID VALVE MV Area (PHT): 5.38 cm     TR Peak grad:   38.9 mmHg MV Decel Time: 141 msec     TR Vmax:        312.00 cm/s MV E velocity: 119.00 cm/s MV A velocity: 59.60 cm/s   SHUNTS MV E/A ratio:  2.00         Systemic VTI:  0.07 m                             Systemic Diam: 2.10 cm Gayatri Acharya MD Electronically signed by Gayatri Acharya MD Signature Date/Time: 05/21/2020/7:35:21 PM    Final     Assessment and Plan:   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 She has had poor urine output recorded although patient says she has been frequently  urinating  - strict I/Os, daily weights, monitor creatinine with diuresis -  Need co-ox and repeat lactic acid.  - will need guideline directed therapy for CHF, however might be difficult with low pressures.  - She is volume up on exam. continue Ted hose and diuresis - Patient will require cath for   new low EF, likely R&L. This will help direct diuretic management. Creatinine normal. Md to see Risks and benefits of cardiac catheterization have been discussed with the patient.  These include bleeding, infection, kidney damage, stroke, heart attack, death.  The patient understands these risks and is willing to proceed.  Elevated troponin - HS 33>45>43 - troponin could be demand ischemia however require cath given low EF - do not see prior ischemic work-up - RF include HTN, HLD, DM2  HLD - pta rosuvastatin 10 mg daily>>increased on admission  Stenosis of prox SMA - No abd symptoms - increase statin dose - per IM  NSVT - 5 beats on tele - wait on BB given acute CHF  DM2 - A1C 9.9 - per primary team  For questions or updates, please contact [facility] Please consult [email or web address] for contact info under    Signed, Zyiere Rosemond H Justyna Timoney, PA-C  05/22/2020 10:05 AM 

## 2020-05-22 NOTE — Interval H&P Note (Signed)
Cath Lab Visit (complete for each Cath Lab visit)  Clinical Evaluation Leading to the Procedure:   ACS: No.  Non-ACS:    Anginal Classification: CCS III  Anti-ischemic medical therapy: No Therapy  Non-Invasive Test Results: No non-invasive testing performed  Prior CABG: No previous CABG      History and Physical Interval Note:  05/22/2020 1:42 PM  Jillian Webb  has presented today for surgery, with the diagnosis of heart failure.  The various methods of treatment have been discussed with the patient and family. After consideration of risks, benefits and other options for treatment, the patient has consented to  Procedure(s): RIGHT/LEFT HEART CATH AND CORONARY ANGIOGRAPHY (N/A) as a surgical intervention.  The patient's history has been reviewed, patient examined, no change in status, stable for surgery.  I have reviewed the patient's chart and labs.  Questions were answered to the patient's satisfaction.     Nicki Guadalajara

## 2020-05-22 NOTE — Progress Notes (Signed)
PROGRESS NOTE    Jillian Bastosatricia Graham  WUJ:[id number] DOB: 03-02-1941 DOA: 05/20/2020 PCP: Cheron SchaumannVelazquez, Gretchen Y., MD   Brief Narrative: 79 year old with past medical history significant for hypertension, diabetes type 2, hyperlipidemia who presents complaining of increasing lower extremity edema shortness of breath, panic attack.  Evaluation in the ED patient was tachycardic, tachypneic, CT abdomen and pelvis show borderline cardiomegaly with a small right greater than left pleural effusion.  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  Elevated BNP at 3000, initial troponin 33, lactic acid 2.4.  Patient admitted for evaluation of heart failure  Assessment & Plan:   Principal Problem:   Acute CHF (congestive heart failure) (HCC) Active Problems:   Fluid overload   Hypokalemia   Elevated troponin   Lactic acidosis   Lesion of adrenal gland (HCC)   Type 2 diabetes mellitus with hyperlipidemia (HCC)   HTN (hypertension)   Superior mesenteric artery atherosclerosis (HCC)   Protein-calorie malnutrition, severe   1-New Systolic and diastolic Heart failure; Echo Showed Ef 20 %, diastolic dysfunction grade 3, mitral valve regurgitation.  Presents with dyspnea, lower extremity edema, elevated BNP. Continue with IV Lasix 20 mg IV twice daily Urine out put not documented.  Strict I and O. Daily weight.  Cardiology consulted. plan for cath.   2-Mild elevation of troponin, Demand ischemia.     3-Lactic acidosis; from decreased perfusion.    4-Severe stenosis of proximal SMA: Agree with increase statins dose.  LDL 141. Will discussed finding with vascular.   5-Left adrenal lesion: She will need follow-up CT scan in a year.  She will also need hormone work-up as an outpatient  6-Type 2 diabetes: Continue with a sliding scale insulin Hypomagnesemia; replete IV>  Repeat labs in am.   Normocytic anemia: B12 low at 123 start intramuscular  supplementation. Hypokalemia; replete with oral potasium times 2.  Dyspa Tobacco use: Cessation encouraged   Estimated body mass index is 18.6 kg/m as calculated from the following:   Height as of 01/15/19: 5' (1.524 m).   Weight as of this encounter: 43.2 kg.   DVT prophylaxis: Lovenox Code Status: DNR Family Communication: Discussed with son who was at bedside Disposition Plan:  Status is: Inpatient  Remains inpatient appropriate because:Ongoing diagnostic testing needed not appropriate for outpatient work up   Dispo: The patient is from: Home              Anticipated d/c is to: Home              Anticipated d/c date is: 2 days              Patient currently is not medically stable to d/c.        Consultants:   none  Procedures:   Echo pending  Antimicrobials:    Subjective: Denies abdominal pain. Son recall patient has complained  of abdominal pain in the past.    Objective: Vitals:   05/21/20 0914 05/21/20 1639 05/21/20 2001 05/22/20 0447  BP: 128/73 (!) 108/54 (!) 106/53 122/63  Pulse: 100 96 95 (!) 102  Resp: 18 18 15 16   Temp: 97.6 F (36.4 C) 97.7 F (36.5 C) 98.1 F (36.7 C) 98.6 F (37 C)  TempSrc: Oral Oral Oral Oral  SpO2: 98% 99% 97% 98%  Weight:    43.2 kg    Intake/Output Summary (Last 24 hours) at 05/22/2020 1331 Last data filed at 05/22/2020 1300 Gross per 24 hour  Intake 340 ml  Output 200 ml  Net 140 ml   Filed Weights   05/20/20 2104 05/21/20 0500 05/22/20 0447  Weight: 43 kg 43 kg 43.2 kg    Examination:  General exam: NAD Respiratory system: Crackles bilaterally.  Cardiovascular system: S 1, S 2 RRR Gastrointestinal system: BS present, soft, nt Central nervous system: Alert, Extremities: plus 2 edema   Data Reviewed: I have personally reviewed following labs and imaging studies  CBC: Recent Labs  Lab 05/20/20 1036 05/21/20 0240 05/22/20 0307  WBC 6.6 6.1 6.7  NEUTROABS 5.4  --   --   HGB 11.1* 9.8* 9.7*    HCT 37.7 33.5* 32.0*  MCV 81.1 80.9 79.6*  PLT 367 324 308   Basic Metabolic Panel: Recent Labs  Lab 05/20/20 1036 05/21/20 0240 05/22/20 0307  NA 135 136 131*  K 3.3* 4.3 3.4*  CL 99 101 98  CO2 GLUCOSE 283* 133* 255*  BUN CREATININE 0.86 1.00 0.93  CALCIUM 8.6* 8.3* 8.0*  MG  --   --  1.1*   GFR: CrCl cannot be calculated (Unknown ideal weight.). Liver Function Tests: Recent Labs  Lab 05/20/20 1036  AST 20  ALT 16  ALKPHOS 97  BILITOT 1.1  PROT 6.8  ALBUMIN 3.6   Recent Labs  Lab 05/20/20 1036  LIPASE 24   No results for input(s): AMMONIA in the last 168 hours. Coagulation Profile: No results for input(s): INR, PROTIME in the last 168 hours. Cardiac Enzymes: No results for input(s): CKTOTAL, CKMB, CKMBINDEX, TROPONINI in the last 168 hours. BNP (last 3 results) No results for input(s): PROBNP in the last 8760 hours. HbA1C: Recent Labs    05/21/20 0240  HGBA1C 9.9*   CBG: Recent Labs  Lab 05/21/20 0634 05/21/20 1724 05/21/20 2114 05/22/20 0701 05/22/20 1111  GLUCAP 175* 162* 175* 227* 290*   Lipid Profile: Recent Labs    05/21/20 0240  CHOL 189  HDL 27*  LDLCALC 141*  TRIG 107  CHOLHDL 7.0   Thyroid Function Tests: Recent Labs    05/21/20 0240  TSH 4.479   Anemia Panel: Recent Labs    05/21/20 0240  VITAMINB12 123*   Sepsis Labs: Recent Labs  Lab 05/20/20 1116 05/20/20 1345 05/22/20 1233  LATICACIDVEN 2.4* 2.6* 3.8*    Recent Results (from the past 240 hour(s))  Blood culture (routine x 2)     Status: None (Preliminary result)   Collection Time: 05/20/20 10:36 AM   Specimen: BLOOD  Result Value Ref Range Status   Specimen Description   Final    BLOOD RIGHT ANTECUBITAL Performed at [facility], [address] [place], [place] [address]    Special Requests   Final    BOTTLES DRAWN AEROBIC AND ANAEROBIC Blood Culture adequate volume Performed at [facility], [address] [place], [place] [address]    Culture   Final    NO GROWTH 2 DAYS Performed at [facility], [address], [place], [place] [address]    Report Status PENDING  Incomplete  SARS Coronavirus 2 by RT PCR (hospital order, performed in [facility] hospital lab) Nasopharyngeal Nasopharyngeal Swab     Status: None   Collection Time: 05/20/20 11:16 AM   Specimen: Nasopharyngeal Swab  Result Value Ref Range Status   SARS Coronavirus 2 NEGATIVE NEGATIVE Final    Comment: (NOTE) SARS-CoV-2 target nucleic acids are NOT  DETECTED.  The SARS-CoV-2 RNA is generally detectable in upper and lower respiratory specimens during the acute phase of infection. The lowest concentration of SARS-CoV-2 viral copies this assay can detect is 250 copies / mL. A negative result does not preclude SARS-CoV-2 infection and should not be used as the sole basis for treatment or other patient management decisions.  A negative result may occur with improper specimen collection / handling, submission of specimen other than nasopharyngeal swab, presence of viral mutation(s) within the areas targeted by this assay, and inadequate number of viral copies (<250 copies / mL). A negative result must be combined with clinical observations, patient history, and epidemiological information.  Fact Sheet for Patients:   BoilerBrush.com.cy  Fact Sheet for Healthcare Providers: https://pope.com/  This test is not yet approved or  cleared by the [place] FDA and has been authorized for detection and/or diagnosis of SARS-CoV-2 by FDA under an Emergency Use Authorization (EUA).  This EUA will remain in effect (meaning this test can be used) for the duration of the COVID-19 declaration under Section 564(b)(1) of the Act, 21 U.S.C. section 360bbb-3(b)(1), unless the authorization is terminated or revoked sooner.  Performed at [facility], [address] [place], [place] [address]   Blood culture (routine x 2)     Status: None (Preliminary result)   Collection Time: 05/20/20 11:16 AM   Specimen: BLOOD RIGHT FOREARM  Result Value Ref Range Status   Specimen Description   Final    BLOOD RIGHT FOREARM Performed at [facility], [address] [place], [place] [address]    Special Requests   Final    BOTTLES DRAWN AEROBIC AND ANAEROBIC Blood Culture adequate volume Performed at [facility], [address] [place], [place] [address]    Culture   Final    NO GROWTH 2 DAYS Performed at [facility], [address], [place], [place] [address]    Report Status PENDING  Incomplete  Urine culture     Status: Abnormal   Collection Time: 05/20/20  1:43 PM   Specimen: Urine, Clean Catch  Result Value Ref Range Status   Specimen Description   Final    URINE, CLEAN CATCH Performed at [facility], [address] [place], [place] [address]    Special Requests   Final    NONE Performed at [facility], [address] [place], [place] [address]    Culture MULTIPLE SPECIES PRESENT, SUGGEST RECOLLECTION (A)  Final   Report Status 05/21/2020 FINAL  Final         Radiology Studies: ECHOCARDIOGRAM COMPLETE  Result Date: 05/21/2020    ECHOCARDIOGRAM REPORT   Patient Name:   Jillian Webb Date of Exam: 05/21/2020 Medical Rec #:  [id number]        Height:       58.0 in Accession #:    [id number]       Weight:       94.8 lb Date of Birth:  10-17-1940        BSA:          1.326 m Patient Age:    79 years         BP:           128/73 mmHg Patient Gender: F                HR:  97 bpm. Exam Location:  Inpatient Procedure: 2D Echo, Cardiac Doppler and Color Doppler Indications:    I50.23 Acute on chronic systolic (congestive) heart failure  History:        Patient has no prior history of Echocardiogram examinations.                 Risk Factors:Diabetes and Dyslipidemia.  Sonographer:     Tiffany Dance Referring Phys: [id number] CHING T TU IMPRESSIONS  1. Left ventricular ejection fraction, by estimation, is 20%. The left ventricle has severely decreased function. The left ventricle demonstrates global hypokinesis. Left ventricular diastolic parameters are consistent with Grade III diastolic dysfunction (restrictive).  2. Right ventricular systolic function is mildly reduced. The right ventricular size is mildly enlarged. There is moderately elevated pulmonary artery systolic pressure. The estimated right ventricular systolic pressure is 53.9 mmHg.  3. Left atrial size was moderately dilated.  4. Right atrial size was moderately dilated.  5. The mitral valve is degenerative. Moderate to severe mitral valve regurgitation. No evidence of mitral stenosis.  6. Tricuspid valve regurgitation is severe.  7. The aortic valve is tricuspid. Aortic valve regurgitation is not visualized. No aortic stenosis is present.  8. The inferior vena cava is dilated in size with <50% respiratory variability, suggesting right atrial pressure of 15 mmHg. FINDINGS  Left Ventricle: Left ventricular ejection fraction, by estimation, is 20%. The left ventricle has severely decreased function. The left ventricle demonstrates global hypokinesis. The left ventricular internal cavity size was normal in size. There is no left ventricular hypertrophy. Left ventricular diastolic parameters are consistent with Grade III diastolic dysfunction (restrictive). Right Ventricle: The right ventricular size is mildly enlarged. No increase in right ventricular wall thickness. Right ventricular systolic function is mildly reduced. There is moderately elevated pulmonary artery systolic pressure. The tricuspid regurgitant velocity is 3.12 m/s, and with an assumed right atrial pressure of 15 mmHg, the estimated right ventricular systolic pressure is 53.9 mmHg. Left Atrium: Left atrial size was moderately dilated. Right Atrium: Right atrial size was  moderately dilated. Pericardium: Trivial pericardial effusion is present. Mitral Valve: The mitral valve is degenerative in appearance. Moderate mitral annular calcification. Moderate to severe mitral valve regurgitation. No evidence of mitral valve stenosis. Tricuspid Valve: The tricuspid valve is grossly normal. Tricuspid valve regurgitation is severe. Aortic Valve: The aortic valve is tricuspid. Aortic valve regurgitation is not visualized. No aortic stenosis is present. Pulmonic Valve: The pulmonic valve was grossly normal. Pulmonic valve regurgitation is trivial. Aorta: The aortic root and ascending aorta are structurally normal, with no evidence of dilitation. Venous: The inferior vena cava is dilated in size with less than 50% respiratory variability, suggesting right atrial pressure of 15 mmHg. IAS/Shunts: No atrial level shunt detected by color flow Doppler.  LEFT VENTRICLE PLAX 2D LVIDd:         4.60 cm  Diastology LVIDs:         4.20 cm  LV e' lateral:   6.53 cm/s LV PW:         0.90 cm  LV E/e' lateral: 18.2 LV IVS:        0.90 cm LVOT diam:     2.10 cm LV SV:         24 LV SV Index:   18 LVOT Area:     3.46 cm  RIGHT VENTRICLE            IVC RV Basal diam:  3.20 cm    IVC diam: 2.60  cm RV Mid diam:    2.20 cm RV S prime:     7.94 cm/s TAPSE (M-mode): 1.3 cm LEFT ATRIUM             Index       RIGHT ATRIUM           Index LA diam:        3.90 cm 2.94 cm/m  RA Area:     18.80 cm LA Vol (A2C):   61.8 ml 46.60 ml/m RA Volume:   52.00 ml  39.21 ml/m LA Vol (A4C):   47.6 ml 35.89 ml/m LA Biplane Vol: 55.0 ml 41.47 ml/m  AORTIC VALVE LVOT Vmax:   45.40 cm/s LVOT Vmean:  30.700 cm/s LVOT VTI:    0.069 m  AORTA Ao Root diam: 2.80 cm Ao Asc diam:  2.95 cm MITRAL VALVE                TRICUSPID VALVE MV Area (PHT): 5.38 cm     TR Peak grad:   38.9 mmHg MV Decel Time: 141 msec     TR Vmax:        312.00 cm/s MV E velocity: 119.00 cm/s MV A velocity: 59.60 cm/s   SHUNTS MV E/A ratio:  2.00         Systemic  VTI:  0.07 m                             Systemic Diam: 2.10 cm Weston Brass MD Electronically signed by Weston Brass MD Signature Date/Time: 05/21/2020/7:35:21 PM    Final         Scheduled Meds: . (feeding supplement) PROSource Plus  30 mL Oral BID BM  . [START ON 05/23/2020] aspirin  81 mg Oral Pre-Cath  . cyanocobalamin  1,000 mcg Intramuscular Daily  . enoxaparin (LOVENOX) injection  40 mg Subcutaneous Q24H  . furosemide  20 mg Intravenous BID  . insulin aspart  0-5 Units Subcutaneous QHS  . insulin aspart  0-9 Units Subcutaneous TID WC  . multivitamin with minerals  1 tablet Oral Daily  . potassium chloride  40 mEq Oral Q4H  . pravastatin  80 mg Oral q1800  . sertraline  50 mg Oral Daily  . sodium chloride flush  3 mL Intravenous Q12H  . sodium chloride flush  3 mL Intravenous Q12H   Continuous Infusions: . sodium chloride       LOS: 2 days    Time spent: 35 minutes    Johnnie Goynes A Malachai Schalk, MD [facility]   If 7PM-7AM, please contact night-coverage [email or web address]  05/22/2020, 1:31 PM

## 2020-05-22 NOTE — Progress Notes (Signed)
Brief cardiology update note:  I spoke with Jillian Webb twice about the results of her tests and what they mean. She didn't remember speaking about it the first time when I saw her the second time, so I am not sure how much she retained.  I spoke to her son [name], who is a Education officer, environmental. I explained her left heart cath, which he had also discussed over the phone with Dr. Tresa Endo. I discussed her right heart cath as well, which is consistent with cardiogenic shock.  I asked for advanced heart failure's opinion to see if she has any advanced therapy options, and she does not.  I spoke with [name] about this and mentioned palliative care. He noted the DNR on her wrist bracelet--he is in agreement with this, but states that is was not part of any of the conversations he has had this admission.  He is very familiar with palliative care and hospice. I noted that the next 24-48 hours will be critical. If we can pull some fluid off, decongest her and maybe get her on some afterload reduction, we should be able to get her home with hospice, which is what he thinks she would like. If however her cardiogenic shock worsens and affects her liver, kidneys, lungs, etc that would be a different situation and a much shorter time course.  His brother will likely be up tomorrow too, may have additional questions.  Gave him my card so that he can reach out with any questions.  Jodelle Red, MD, PhD [facility]  [address] [place], [place] [address] [phone]

## 2020-05-22 NOTE — Progress Notes (Signed)
CRITICAL VALUE ALERT  Critical Value:  Lactic acid 3.8  Date & Time Notied:  12/21/2019 1334  Provider Notified: Dr. Sunnie Nielsen

## 2020-05-22 NOTE — Progress Notes (Signed)
SLP Cancellation Note  Patient Details Name: Jillian Webb MRN: [id number] DOB: Dec 31, 1940   Cancelled treatment:       Reason Eval/Treat Not Completed: Other (comment) Unable to complete BSE at this time, as pt is currently NPO for heart cath. Will recheck later as schedule allows.  Choking episode on Dr. Reino Kent in April, turned into PNA. Now having difficulty with pills per pt.  Vern Prestia B. Murvin Natal, [facility], CCC-SLP Speech Language Pathologist Office: [phone]  Leigh Aurora 05/22/2020, 12:04 PM

## 2020-05-22 NOTE — Progress Notes (Signed)
Site area: Right groin a 5 french arterial and 7 french venous sheath was removed  Site Prior to Removal:  Level 0  Pressure Applied For 20 MINUTES    Bedrest Beginning at 1540p  Manual:   Yes.    Patient Status During Pull:  stable  Post Pull Groin Site:  Level 0  Post Pull Instructions Given:  Yes.    Post Pull Pulses Present:  Yes.    Dressing Applied:  Yes.    Comments:

## 2020-05-23 LAB — BASIC METABOLIC PANEL
Anion gap: 13 (ref 5–15)
BUN: 19 mg/dL (ref 8–23)
CO2: 26 mmol/L (ref 22–32)
Calcium: 8.4 mg/dL — ABNORMAL LOW (ref 8.9–10.3)
Chloride: 97 mmol/L — ABNORMAL LOW (ref 98–111)
Creatinine, Ser: 0.93 mg/dL (ref 0.44–1.00)
GFR calc Af Amer: >60 mL/min (ref >60)
GFR calc non Af Amer: 58 mL/min — ABNORMAL LOW (ref >60)
Glucose, Bld: 151 mg/dL — ABNORMAL HIGH (ref 70–99)
Potassium: 3.6 mmol/L (ref 3.5–5.1)
Sodium: 136 mmol/L (ref 135–145)

## 2020-05-23 LAB — CBC
HCT: 34.3 % — ABNORMAL LOW (ref 36.0–46.0)
Hemoglobin: 10.5 g/dL — ABNORMAL LOW (ref 12.0–15.0)
MCH: 24.4 pg — ABNORMAL LOW (ref 26.0–34.0)
MCHC: 30.6 g/dL (ref 30.0–36.0)
MCV: 79.8 fL — ABNORMAL LOW (ref 80.0–100.0)
Platelets: 313 10*3/uL (ref 150–400)
RBC: 4.3 MIL/uL (ref 3.87–5.11)
RDW: 18.5 % — ABNORMAL HIGH (ref 11.5–15.5)
WBC: 10.2 10*3/uL (ref 4.0–10.5)
nRBC: 0 % (ref 0.0–0.2)

## 2020-05-23 LAB — GLUCOSE, CAPILLARY
Glucose-Capillary: 126 mg/dL — ABNORMAL HIGH (ref 70–99)
Glucose-Capillary: 342 mg/dL — ABNORMAL HIGH (ref 70–99)
Glucose-Capillary: 201 mg/dL — ABNORMAL HIGH (ref 70–99)
Glucose-Capillary: 243 mg/dL — ABNORMAL HIGH (ref 70–99)

## 2020-05-23 LAB — MAGNESIUM: Magnesium: 1.7 mg/dL (ref 1.7–2.4)

## 2020-05-23 LAB — MRSA PCR SCREENING: MRSA by PCR: NEGATIVE

## 2020-05-23 MED ORDER — FUROSEMIDE 10 MG/ML IJ SOLN
40.0000 mg | Freq: Two times a day (BID) | INTRAMUSCULAR | Status: DC
  Administered 2020-05-23 – 2020-05-24 (×2): 40 mg via INTRAVENOUS
  Filled 2020-05-23 (×2): qty 4

## 2020-05-23 MED ORDER — MAGNESIUM OXIDE 400 (241.3 MG) MG PO TABS
400.0000 mg | ORAL_TABLET | Freq: Two times a day (BID) | ORAL | Status: AC
  Administered 2020-05-23 (×2): 400 mg via ORAL
  Filled 2020-05-23 (×2): qty 1

## 2020-05-23 MED ORDER — DIGOXIN 125 MCG PO TABS
0.1250 mg | ORAL_TABLET | Freq: Every day | ORAL | Status: DC
  Administered 2020-05-23 – 2020-05-25 (×3): 0.125 mg via ORAL
  Filled 2020-05-23 (×3): qty 1

## 2020-05-23 MED ORDER — VITAMIN B-12 100 MCG PO TABS
100.0000 ug | ORAL_TABLET | Freq: Every day | ORAL | Status: DC
  Administered 2020-05-24 – 2020-05-25 (×2): 100 ug via ORAL
  Filled 2020-05-23 (×2): qty 1

## 2020-05-23 NOTE — Progress Notes (Addendum)
PROGRESS NOTE    Jillian Webb  ZOX:[id number] DOB: [birth year] DOA: 05/20/2020 PCP: Cheron Schaumann., MD   Brief Narrative: 79 year old with past medical history significant for hypertension, diabetes type 2, hyperlipidemia who presents complaining of increasing lower extremity edema shortness of breath, panic attack.  Evaluation in the ED patient was tachycardic, tachypneic, CT abdomen and pelvis show borderline cardiomegaly with a small right greater than left pleural effusion.  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  Elevated BNP at 3000, initial troponin 33, lactic acid 2.4.  Patient admitted for evaluation of heart failure.  Assessment & Plan:   Principal Problem:   Acute CHF (congestive heart failure) (HCC) Active Problems:   Fluid overload   Hypokalemia   Elevated troponin   Lactic acidosis   Lesion of adrenal gland (HCC)   Type 2 diabetes mellitus with hyperlipidemia (HCC)   HTN (hypertension)   Superior mesenteric artery atherosclerosis (HCC)   Protein-calorie malnutrition, severe   1-New Systolic and diastolic Heart failure; Mixed ischemic/ non ischemic cardiomyopathy  Echo Showed Ef 20 %, diastolic dysfunction grade 3, mitral valve regurgitation.  Presents with dyspnea, lower extremity edema, elevated BNP. Continue with IV Lasix 20 mg IV twice daily Urine out put not documented.  Strict I and O. Daily weight.  -Cardiology consulted. Underwent Heart cath; severely calcified coronary arteries with 80% calcified ostial/proximal left main stenosis, calcified LAD with 55 to 60% mid stenosis, 20-50 proximal circumflex stenosis with 70% AV groove stenosis dominant RCA with 30% diffuse irregularity in the mid vessel with 70% stenosis proximal to the acute margin.  -Evaluated by heart failure team, due to patient comorbidities and cachexia/Faility she is not a candidate for advanced therapy.  They were recommending  palliative care hospice care. -not good candidate for CABG.  -appreciate cardiology evaluation.   2-Mild elevation of troponin, Demand ischemia.    3-Lactic acidosis; from decreased perfusion.    4-Severe stenosis of proximal SMA: Agree with increase statins dose.  LDL 141. Discussed with Dr Randie Heinz, CT finding likely SMA. Continue with statins. Heart problem take precedents.    5-Left adrenal lesion: She will need follow-up CT scan in a year.  She will also need hormone work-up as an outpatient  6-Type 2 diabetes: Continue with a sliding scale insulin Hypomagnesemia; replete IV>  Repeat labs in am.   Normocytic anemia: B12 low at 123 start intramuscular supplementation. Hypokalemia; replete with oral potasium times 2.  Dyspa Tobacco use: Cessation encouraged Acute Metabolic encephalopathy, Delirium; was more confuse overnight.  Delirium precaution./   Estimated body mass index is 18.77 kg/m as calculated from the following:   Height as of 01/15/19: 5' (1.524 m).   Weight as of this encounter: 43.6 kg.   DVT prophylaxis: Lovenox Code Status: DNR Family Communication: Discussed with son who was at bedside Disposition Plan:  Status is: Inpatient  Remains inpatient appropriate because:Ongoing diagnostic testing needed not appropriate for outpatient work up   Dispo: The patient is from: Home              Anticipated d/c is to: Home              Anticipated d/c date is: 2 days              Patient currently is not medically stable to d/c.        Consultants:   none  Procedures:   Echo pending  Antimicrobials:    Subjective:  She has been more confuse. Denies worsening dyspnea.   Objective: Vitals:   05/22/20 2308 05/22/20 2330 05/23/20 0237 05/23/20 0419  BP: 116/63 112/69 127/80 (!) 116/58  Pulse: (!) 105 (!) 104 (!) 101 100  Resp: 16 18 20 20   Temp: 97.6 F (36.4 C)  97.8 F (36.6 C) 97.9 F (36.6 C)  TempSrc: Oral  Oral Oral  SpO2: 97% 97% 92%  93%  Weight:   43.6 kg     Intake/Output Summary (Last 24 hours) at 05/23/2020 0718 Last data filed at 05/23/2020 0000 Gross per 24 hour  Intake 368.13 ml  Output 1250 ml  Net -881.87 ml   Filed Weights   05/21/20 0500 05/22/20 0447 05/23/20 0237  Weight: 43 kg 43.2 kg 43.6 kg    Examination:  General exam: NAD Respiratory system: B/L crackles.  Cardiovascular system: S 1, S 2 RRR Gastrointestinal system: BS present, soft, nt Central nervous system: alert, confuse Extremities: trace edema   Data Reviewed: I have personally reviewed following labs and imaging studies  CBC: Recent Labs  Lab 05/20/20 1036 05/20/20 1036 05/21/20 0240 05/22/20 0307 05/22/20 1417 05/22/20 1422 05/23/20 0026  WBC 6.6  --  6.1 6.7  --   --  10.2  NEUTROABS 5.4  --   --   --   --   --   --   HGB 11.1*   < > 9.8* 9.7* 11.2* 11.2*  11.2* 10.5*  HCT 37.7   < > 33.5* 32.0* 33.0* 33.0*  33.0* 34.3*  MCV 81.1  --  80.9 79.6*  --   --  79.8*  PLT 367  --  324 308  --   --  313   < > = values in this interval not displayed.   Basic Metabolic Panel: Recent Labs  Lab 05/20/20 1036 05/20/20 1036 05/21/20 0240 05/22/20 0307 05/22/20 1417 05/22/20 1422 05/23/20 0026  NA 135   < > 136 131* 136 137  136 136  K 3.3*   < > 4.3 3.4* 3.5 3.5  3.5 3.6  CL 99  --  101 98  --   --  97*  CO2 23  --  23 22  --   --  26  GLUCOSE 283*  --  133* 255*  --   --  151*  BUN 16  --  16 19  --   --  19  CREATININE 0.86  --  1.00 0.93  --   --  0.93  CALCIUM 8.6*  --  8.3* 8.0*  --   --  8.4*  MG  --   --   --  1.1*  --   --  1.7   < > = values in this interval not displayed.   GFR: CrCl cannot be calculated (Unknown ideal weight.). Liver Function Tests: Recent Labs  Lab 05/20/20 1036  AST 20  ALT 16  ALKPHOS 97  BILITOT 1.1  PROT 6.8  ALBUMIN 3.6   Recent Labs  Lab 05/20/20 1036  LIPASE 24   No results for input(s): AMMONIA in the last 168 hours. Coagulation Profile: No results for  input(s): INR, PROTIME in the last 168 hours. Cardiac Enzymes: No results for input(s): CKTOTAL, CKMB, CKMBINDEX, TROPONINI in the last 168 hours. BNP (last 3 results) No results for input(s): PROBNP in the last 8760 hours. HbA1C: Recent Labs    05/21/20 0240  HGBA1C 9.9*   CBG: Recent Labs  Lab 05/22/20 0701 05/22/20 1111  05/22/20 1515 05/22/20 2102 05/23/20 0626  GLUCAP 227* 290* 100* 262* 126*   Lipid Profile: Recent Labs    05/21/20 0240  CHOL 189  HDL 27*  LDLCALC 141*  TRIG 107  CHOLHDL 7.0   Thyroid Function Tests: Recent Labs    05/21/20 0240  TSH 4.479   Anemia Panel: Recent Labs    05/21/20 0240  VITAMINB12 123*   Sepsis Labs: Recent Labs  Lab 05/20/20 1116 05/20/20 1345 05/22/20 1233  LATICACIDVEN 2.4* 2.6* 3.8*    Recent Results (from the past 240 hour(s))  Blood culture (routine x 2)     Status: None (Preliminary result)   Collection Time: 05/20/20 10:36 AM   Specimen: BLOOD  Result Value Ref Range Status   Specimen Description   Final    BLOOD RIGHT ANTECUBITAL Performed at [facility], [address] [place], [place] [address]    Special Requests   Final    BOTTLES DRAWN AEROBIC AND ANAEROBIC Blood Culture adequate volume Performed at [facility], [address] [place], [place] [address]    Culture   Final    NO GROWTH 2 DAYS Performed at [facility], [address], [place], [place] [address]    Report Status PENDING  Incomplete  SARS Coronavirus 2 by RT PCR (hospital order, performed in [facility] hospital lab) Nasopharyngeal Nasopharyngeal Swab     Status: None   Collection Time: 05/20/20 11:16 AM   Specimen: Nasopharyngeal Swab  Result Value Ref Range Status   SARS Coronavirus 2 NEGATIVE NEGATIVE Final    Comment: (NOTE) SARS-CoV-2 target nucleic acids are NOT DETECTED.  The SARS-CoV-2 RNA is generally detectable in upper and lower respiratory specimens during the acute phase of  infection. The lowest concentration of SARS-CoV-2 viral copies this assay can detect is 250 copies / mL. A negative result does not preclude SARS-CoV-2 infection and should not be used as the sole basis for treatment or other patient management decisions.  A negative result may occur with improper specimen collection / handling, submission of specimen other than nasopharyngeal swab, presence of viral mutation(s) within the areas targeted by this assay, and inadequate number of viral copies (<250 copies / mL). A negative result must be combined with clinical observations, patient history, and epidemiological information.  Fact Sheet for Patients:   BoilerBrush.com.cy  Fact Sheet for Healthcare Providers: https://pope.com/  This test is not yet approved or  cleared by the [place] FDA and has been authorized for detection and/or diagnosis of SARS-CoV-2 by FDA under an Emergency Use Authorization (EUA).  This EUA will remain in effect (meaning this test can be used) for the duration of the COVID-19 declaration under Section 564(b)(1) of the Act, 21 U.S.C. section 360bbb-3(b)(1), unless the authorization is terminated or revoked sooner.  Performed at [facility], [address] [place], [place] [address]   Blood culture (routine x 2)     Status: None (Preliminary result)   Collection Time: 05/20/20 11:16 AM   Specimen: BLOOD RIGHT FOREARM  Result Value Ref Range Status   Specimen Description   Final    BLOOD RIGHT FOREARM Performed at [facility], [address] [employer] [place] [place], [place] [address]    Special Requests   Final    BOTTLES DRAWN AEROBIC AND ANAEROBIC Blood Culture adequate volume Performed at [facility], [address] [place], [place] [address]    Culture  Final    NO GROWTH 2 DAYS Performed at [facility], [address] [place], [place] [address]    Report Status PENDING   Incomplete  Urine culture     Status: Abnormal   Collection Time: 05/20/20  1:43 PM   Specimen: Urine, Clean Catch  Result Value Ref Range Status   Specimen Description   Final    URINE, CLEAN CATCH Performed at [facility] [place], [address] [place], [place] [address]    Special Requests   Final    NONE Performed at [facility] [place], [address] [place], [place] [address]    Culture MULTIPLE SPECIES PRESENT, SUGGEST RECOLLECTION (A)  Final   Report Status 05/21/2020 FINAL  Final  MRSA PCR Screening     Status: None   Collection Time: 05/22/20  8:30 PM   Specimen: Nasal Mucosa; Nasopharyngeal  Result Value Ref Range Status   MRSA by PCR NEGATIVE NEGATIVE Final    Comment:        The GeneXpert MRSA Assay (FDA approved for NASAL specimens only), is one component of a comprehensive MRSA colonization surveillance program. It is not intended to diagnose MRSA infection nor to guide or monitor treatment for MRSA infections. Performed at [facility], [address] [place], [place] [address]          Radiology Studies: CARDIAC CATHETERIZATION  Result Date: 05/22/2020  Suezanne JacquetOst LM lesion is 80% stenosed.  Mid RCA lesion is 70% stenosed.  Prox RCA lesion is 30% stenosed.  Ost Cx lesion is 20% stenosed.  Prox Cx lesion is 50% stenosed.  1st Mrg lesion is 70% stenosed.  Mid LAD lesion is 60% stenosed.  Mid Cx lesion is 20% stenosed.  Severely calcified coronary arteries with 80% calcified ostial/proximal left main stenosis with pressure dampening; calcified LAD with 55 to 60% mid stenosis; 20 and 50% proximal circumflex stenoses with 70% AV groove stenosis; dominant RCA with 30% diffuse irregularity in the mid vessel with 70% stenosis proximal to the acute margin. Echo documentation of EF 20% with grade 3 restrictive physiology, biatrial enlargement, and moderate to severe MR and severe TR. RECOMMENDATION: Patient has severe cardiomyopathy.  Vessels are  significantly calcified particularly at the ostium of the left main with at least ostial to proximal 80% left main stenosis.  The patient will be transported from the Cath Lab to 2H.  She may require inotropic therapy and advanced heart failure management and surgical evaluation.  Findings discussed with Dr. Janne NapoleonBridget Christopher who is the consulting cardiologist.   ECHOCARDIOGRAM COMPLETE  Result Date: 05/21/2020    ECHOCARDIOGRAM REPORT   Patient Name:   Jillian Webb Date of Exam: 05/21/2020 Medical Rec #:  [id number]        Height:       58.0 in Accession #:    [id number]       Weight:       94.8 lb Date of Birth:  07-Nov-1940        BSA:          1.326 m Patient Age:    79 years         BP:           128/73 mmHg Patient Gender: F                HR:           97 bpm. Exam Location:  Inpatient Procedure: 2D Echo,  Cardiac Doppler and Color Doppler Indications:    I50.23 Acute on chronic systolic (congestive) heart failure  History:        Patient has no prior history of Echocardiogram examinations.                 Risk Factors:Diabetes and Dyslipidemia.  Sonographer:    Tiffany Dance Referring Phys: [id number] CHING T TU IMPRESSIONS  1. Left ventricular ejection fraction, by estimation, is 20%. The left ventricle has severely decreased function. The left ventricle demonstrates global hypokinesis. Left ventricular diastolic parameters are consistent with Grade III diastolic dysfunction (restrictive).  2. Right ventricular systolic function is mildly reduced. The right ventricular size is mildly enlarged. There is moderately elevated pulmonary artery systolic pressure. The estimated right ventricular systolic pressure is 53.9 mmHg.  3. Left atrial size was moderately dilated.  4. Right atrial size was moderately dilated.  5. The mitral valve is degenerative. Moderate to severe mitral valve regurgitation. No evidence of mitral stenosis.  6. Tricuspid valve regurgitation is severe.  7. The aortic valve is tricuspid.  Aortic valve regurgitation is not visualized. No aortic stenosis is present.  8. The inferior vena cava is dilated in size with <50% respiratory variability, suggesting right atrial pressure of 15 mmHg. FINDINGS  Left Ventricle: Left ventricular ejection fraction, by estimation, is 20%. The left ventricle has severely decreased function. The left ventricle demonstrates global hypokinesis. The left ventricular internal cavity size was normal in size. There is no left ventricular hypertrophy. Left ventricular diastolic parameters are consistent with Grade III diastolic dysfunction (restrictive). Right Ventricle: The right ventricular size is mildly enlarged. No increase in right ventricular wall thickness. Right ventricular systolic function is mildly reduced. There is moderately elevated pulmonary artery systolic pressure. The tricuspid regurgitant velocity is 3.12 m/s, and with an assumed right atrial pressure of 15 mmHg, the estimated right ventricular systolic pressure is 53.9 mmHg. Left Atrium: Left atrial size was moderately dilated. Right Atrium: Right atrial size was moderately dilated. Pericardium: Trivial pericardial effusion is present. Mitral Valve: The mitral valve is degenerative in appearance. Moderate mitral annular calcification. Moderate to severe mitral valve regurgitation. No evidence of mitral valve stenosis. Tricuspid Valve: The tricuspid valve is grossly normal. Tricuspid valve regurgitation is severe. Aortic Valve: The aortic valve is tricuspid. Aortic valve regurgitation is not visualized. No aortic stenosis is present. Pulmonic Valve: The pulmonic valve was grossly normal. Pulmonic valve regurgitation is trivial. Aorta: The aortic root and ascending aorta are structurally normal, with no evidence of dilitation. Venous: The inferior vena cava is dilated in size with less than 50% respiratory variability, suggesting right atrial pressure of 15 mmHg. IAS/Shunts: No atrial level shunt detected by  color flow Doppler.  LEFT VENTRICLE PLAX 2D LVIDd:         4.60 cm  Diastology LVIDs:         4.20 cm  LV e' lateral:   6.53 cm/s LV PW:         0.90 cm  LV E/e' lateral: 18.2 LV IVS:        0.90 cm LVOT diam:     2.10 cm LV SV:         24 LV SV Index:   18 LVOT Area:     3.46 cm  RIGHT VENTRICLE            IVC RV Basal diam:  3.20 cm    IVC diam: 2.60 cm RV Mid diam:    2.20 cm  RV S prime:     7.94 cm/s TAPSE (M-mode): 1.3 cm LEFT ATRIUM             Index       RIGHT ATRIUM           Index LA diam:        3.90 cm 2.94 cm/m  RA Area:     18.80 cm LA Vol (A2C):   61.8 ml 46.60 ml/m RA Volume:   52.00 ml  39.21 ml/m LA Vol (A4C):   47.6 ml 35.89 ml/m LA Biplane Vol: 55.0 ml 41.47 ml/m  AORTIC VALVE LVOT Vmax:   45.40 cm/s LVOT Vmean:  30.700 cm/s LVOT VTI:    0.069 m  AORTA Ao Root diam: 2.80 cm Ao Asc diam:  2.95 cm MITRAL VALVE                TRICUSPID VALVE MV Area (PHT): 5.38 cm     TR Peak grad:   38.9 mmHg MV Decel Time: 141 msec     TR Vmax:        312.00 cm/s MV E velocity: 119.00 cm/s MV A velocity: 59.60 cm/s   SHUNTS MV E/A ratio:  2.00         Systemic VTI:  0.07 m                             Systemic Diam: 2.10 cm Weston Brass MD Electronically signed by Weston Brass MD Signature Date/Time: 05/21/2020/7:35:21 PM    Final         Scheduled Meds: . (feeding supplement) PROSource Plus  30 mL Oral BID BM  . aspirin  81 mg Oral Daily  . atorvastatin  80 mg Oral Daily  . cyanocobalamin  1,000 mcg Intramuscular Daily  . furosemide  40 mg Intravenous TID  . heparin  5,000 Units Subcutaneous Q8H  . insulin aspart  0-5 Units Subcutaneous QHS  . insulin aspart  0-9 Units Subcutaneous TID WC  . multivitamin with minerals  1 tablet Oral Daily  . sertraline  50 mg Oral Daily  . sodium chloride flush  3 mL Intravenous Q12H  . [START ON 05/24/2020] vitamin B-12  100 mcg Oral Daily   Continuous Infusions: . sodium chloride       LOS: 3 days    Time spent: 35 minutes    Khylen Riolo A  Antonina Deziel, MD [facility]   If 7PM-7AM, please contact night-coverage [email or web address]  05/23/2020, 7:18 AM

## 2020-05-23 NOTE — TOC Initial Note (Signed)
Transition of Care [facility]) - Initial/Assessment Note    Patient Details  Name: Jillian Webb MRN: [id number] Date of Birth: [birth year]  Transition [facility] CM/SW Contact:    Lawerance Sabal, RN Phone Number: 05/23/2020, 4:17 PM  Clinical Narrative:      Sherron Monday w patient's son [name] on the phone. He states that they are interested in [facility] services. He states that patient lives at home at the address on file, lives with son and daughter in Social worker. [name] confirms that she will have 24 hour support at home.  He states that they would like [facility], referral accepted by Dennie Bible, who will get in touch with son tomorrow AM. [name] states he will transport home via private car. Patient has DME RW WC available. Family requesting hospital bed, they have cleared out room at home for it already.      Aashvi, Rezabek Son   [phone]  [place], [place]   [phone]                 Expected Discharge Plan: Home w Hospice Care Barriers to Discharge: Continued Medical Work up   Patient Goals and CMS Choice Patient states their goals for this hospitalization and ongoing recovery are:: to go home w hospice CMS Medicare.gov Compare Post Acute Care list provided to:: Other (Comment Required) Choice offered to / list presented to : Adult Children  Expected Discharge Plan and Services Expected Discharge Plan: Home w Hospice Care                                     [facility] Agency: [facility] Date [facility] Agency Contacted: 05/23/20 Time HH Agency Contacted: 1617 Representative spoke with at [facility] Agency: Dennie Bible  Prior Living Arrangements/Services                       Activities of Daily Living Home Assistive Devices/Equipment: None, Other (Comment) (have walker but dont use them) ADL Screening (condition at time of admission) Patient's cognitive ability adequate to safely complete daily activities?: Yes Is the patient deaf or have difficulty hearing?:  Yes Does the patient have difficulty seeing, even when wearing glasses/contacts?: Yes Does the patient have difficulty concentrating, remembering, or making decisions?: Yes Patient able to express need for assistance with ADLs?: Yes Does the patient have difficulty dressing or bathing?: No Independently performs ADLs?: Yes (appropriate for developmental age) Does the patient have difficulty walking or climbing stairs?: Yes Weakness of Legs: Both Weakness of Arms/Hands: None  Permission Sought/Granted                  Emotional Assessment              Admission diagnosis:  Fluid overload [E87.70] Acute heart failure, unspecified heart failure type (HCC) [I50.9] Acute CHF (congestive heart failure) (HCC) [I50.9] Patient Active Problem List   Diagnosis Date Noted  . Protein-calorie malnutrition, severe 05/21/2020  . Fluid overload 05/20/2020  . Acute CHF (congestive heart failure) (HCC) 05/20/2020  . Hypokalemia 05/20/2020  . Elevated troponin 05/20/2020  . Lactic acidosis 05/20/2020  . Lesion of adrenal gland (HCC) 05/20/2020  . Type 2 diabetes mellitus with hyperlipidemia (HCC) 05/20/2020  . HTN (hypertension) 05/20/2020  . Superior mesenteric artery atherosclerosis (HCC) 05/20/2020   PCP:  Cheron Schaumann., MD Pharmacy:   [facility] [place], [place] -  [address] [address] Phone: [phone] Fax: [phone]     Social Determinants of Health (SDOH) Interventions    Readmission Risk Interventions No flowsheet data found.

## 2020-05-23 NOTE — Progress Notes (Signed)
Progress Note  [name] Date of Encounter: 05/23/2020  Primary Cardiologist: Jodelle RedBridgette Christopher, MD  Subjective   Patient reports no breathlessness at rest or chest pain.  Some intermittent confusion noted per chart review, trying to get out of bed, precautions put in place for her safety.  Inpatient Medications    Scheduled Meds: . (feeding supplement) PROSource Plus  30 mL Oral BID BM  . aspirin  81 mg Oral Daily  . atorvastatin  80 mg Oral Daily  . cyanocobalamin  1,000 mcg Intramuscular Daily  . furosemide  40 mg Intravenous TID  . heparin  5,000 Units Subcutaneous Q8H  . insulin aspart  0-5 Units Subcutaneous QHS  . insulin aspart  0-9 Units Subcutaneous TID WC  . multivitamin with minerals  1 tablet Oral Daily  . sertraline  50 mg Oral Daily  . sodium chloride flush  3 mL Intravenous Q12H  . [START ON 05/24/2020] vitamin B-12  100 mcg Oral Daily   Continuous Infusions: . sodium chloride     PRN Meds: sodium chloride, acetaminophen, ondansetron (ZOFRAN) IV, sodium chloride flush   Vital Signs    Vitals:   05/22/20 2330 05/23/20 0237 05/23/20 0419 05/23/20 0732  BP: 112/69 127/80 (!) 116/58 105/70  Pulse: (!) 104 (!) 101 100   Resp: 18 20 20    Temp:  97.8 F (36.6 C) 97.9 F (36.6 C) 97.6 F (36.4 C)  TempSrc:  Oral Oral Axillary  SpO2: 97% 92% 93% 92%  Weight:  43.6 kg      Intake/Output Summary (Last 24 hours) at 05/23/2020 0903 Last data filed at 05/23/2020 0000 Gross per 24 hour  Intake 248.13 ml  Output 1250 ml  Net -1001.87 ml   Filed Weights   05/21/20 0500 05/22/20 0447 05/23/20 0237  Weight: 43 kg 43.2 kg 43.6 kg    Telemetry    Sinus rhythm.  Personally reviewed.  ECG    An ECG dated 05/20/2020 was personally reviewed today and demonstrated:  Sinus rhythm with poor R wave progression, rule out old anterior infarct pattern.  Physical Exam   GEN:  Frail-appearing elderly woman, calm.  Neck: No JVD. Cardiac:  RRR, 2/6 systolic murmur, no gallop.  Respiratory: Nonlabored. Clear to auscultation bilaterally. GI: Soft, nontender, bowel sounds present. MS:  Mild ankle edema; No deformity.  Labs    Chemistry Recent Labs  Lab 05/20/20 1036 05/20/20 1036 05/21/20 0240 05/21/20 0240 05/22/20 0307 05/22/20 0307 05/22/20 1417 05/22/20 1422 05/23/20 0026  NA 135   < > 136   < > 131*   < > 136 137  136 136  K 3.3*   < > 4.3   < > 3.4*   < > 3.5 3.5  3.5 3.6  CL 99   < > 101  --  98  --   --   --  97*  CO2 23   < > 23  --  22  --   --   --  26  GLUCOSE 283*   < > 133*  --  255*  --   --   --  151*  BUN 16   < > 16  --  19  --   --   --  19  CREATININE 0.86   < > 1.00  --  0.93  --   --   --  0.93  CALCIUM 8.6*   < > 8.3*  --  8.0*  --   --   --  8.4*  PROT 6.8  --   --   --   --   --   --   --   --   ALBUMIN 3.6  --   --   --   --   --   --   --   --   AST 20  --   --   --   --   --   --   --   --   ALT 16  --   --   --   --   --   --   --   --   ALKPHOS 97  --   --   --   --   --   --   --   --   BILITOT 1.1  --   --   --   --   --   --   --   --   GFRNONAA >60   < > 54*  --  58*  --   --   --  58*  GFRAA >60   < > >60  --  >60  --   --   --  >60  ANIONGAP 13   < > 12  --  11  --   --   --  13   < > = values in this interval not displayed.     Hematology Recent Labs  Lab 05/21/20 0240 05/21/20 0240 05/22/20 0307 05/22/20 0307 05/22/20 1417 05/22/20 1422 05/23/20 0026  WBC 6.1  --  6.7  --   --   --  10.2  RBC 4.14  --  4.02  --   --   --  4.30  HGB 9.8*   < > 9.7*   < > 11.2* 11.2*  11.2* 10.5*  HCT 33.5*   < > 32.0*   < > 33.0* 33.0*  33.0* 34.3*  MCV 80.9  --  79.6*  --   --   --  79.8*  MCH 23.7*  --  24.1*  --   --   --  24.4*  MCHC 29.3*  --  30.3  --   --   --  30.6  RDW 18.5*  --  18.3*  --   --   --  18.5*  PLT 324  --  308  --   --   --  313   < > = values in this interval not displayed.    Cardiac Enzymes Recent Labs  Lab 05/20/20 1036 05/20/20 2119  05/20/20 2314  TROPONINIHS 33* 45* 43*    BNP Recent Labs  Lab 05/20/20 1036  BNP 3,975.8*     Radiology    CARDIAC CATHETERIZATION  Result Date: 05/22/2020  Ost LM lesion is 80% stenosed.  Mid RCA lesion is 70% stenosed.  Prox RCA lesion is 30% stenosed.  Ost Cx lesion is 20% stenosed.  Prox Cx lesion is 50% stenosed.  1st Mrg lesion is 70% stenosed.  Mid LAD lesion is 60% stenosed.  Mid Cx lesion is 20% stenosed.  Severely calcified coronary arteries with 80% calcified ostial/proximal left main stenosis with pressure dampening; calcified LAD with 55 to 60% mid stenosis; 20 and 50% proximal circumflex stenoses with 70% AV groove stenosis; dominant RCA with 30% diffuse irregularity in the mid vessel with 70% stenosis proximal to the acute margin. Echo documentation of EF 20% with grade 3 restrictive physiology, biatrial enlargement, and moderate to severe MR  and severe TR. RECOMMENDATION: Patient has severe cardiomyopathy.  Vessels are significantly calcified particularly at the ostium of the left main with at least ostial to proximal 80% left main stenosis.  The patient will be transported from the Cath Lab to 2H.  She may require inotropic therapy and advanced heart failure management and surgical evaluation.  Findings discussed with Dr. Janne Napoleon who is the consulting cardiologist.   ECHOCARDIOGRAM COMPLETE  Result Date: 05/21/2020    ECHOCARDIOGRAM REPORT   Patient Name:   Jillian Webb Date of Exam: 05/21/2020 Medical Rec #:  [id number]        Height:       58.0 in Accession #:    [id number]       Weight:       94.8 lb Date of Birth:  [birth year]        BSA:          1.326 m Patient Age:    79 years         BP:           128/73 mmHg Patient Gender: F                HR:           97 bpm. Exam Location:  Inpatient Procedure: 2D Echo, Cardiac Doppler and Color Doppler Indications:    I50.23 Acute on chronic systolic (congestive) heart failure  History:        Patient has no  prior history of Echocardiogram examinations.                 Risk Factors:Diabetes and Dyslipidemia.  Sonographer:    Tiffany Dance Referring Phys: [id number] CHING T TU IMPRESSIONS  1. Left ventricular ejection fraction, by estimation, is 20%. The left ventricle has severely decreased function. The left ventricle demonstrates global hypokinesis. Left ventricular diastolic parameters are consistent with Grade III diastolic dysfunction (restrictive).  2. Right ventricular systolic function is mildly reduced. The right ventricular size is mildly enlarged. There is moderately elevated pulmonary artery systolic pressure. The estimated right ventricular systolic pressure is 53.9 mmHg.  3. Left atrial size was moderately dilated.  4. Right atrial size was moderately dilated.  5. The mitral valve is degenerative. Moderate to severe mitral valve regurgitation. No evidence of mitral stenosis.  6. Tricuspid valve regurgitation is severe.  7. The aortic valve is tricuspid. Aortic valve regurgitation is not visualized. No aortic stenosis is present.  8. The inferior vena cava is dilated in size with <50% respiratory variability, suggesting right atrial pressure of 15 mmHg. FINDINGS  Left Ventricle: Left ventricular ejection fraction, by estimation, is 20%. The left ventricle has severely decreased function. The left ventricle demonstrates global hypokinesis. The left ventricular internal cavity size was normal in size. There is no left ventricular hypertrophy. Left ventricular diastolic parameters are consistent with Grade III diastolic dysfunction (restrictive). Right Ventricle: The right ventricular size is mildly enlarged. No increase in right ventricular wall thickness. Right ventricular systolic function is mildly reduced. There is moderately elevated pulmonary artery systolic pressure. The tricuspid regurgitant velocity is 3.12 m/s, and with an assumed right atrial pressure of 15 mmHg, the estimated right ventricular  systolic pressure is 53.9 mmHg. Left Atrium: Left atrial size was moderately dilated. Right Atrium: Right atrial size was moderately dilated. Pericardium: Trivial pericardial effusion is present. Mitral Valve: The mitral valve is degenerative in appearance. Moderate mitral annular calcification. Moderate to severe mitral valve regurgitation. No evidence of mitral valve  stenosis. Tricuspid Valve: The tricuspid valve is grossly normal. Tricuspid valve regurgitation is severe. Aortic Valve: The aortic valve is tricuspid. Aortic valve regurgitation is not visualized. No aortic stenosis is present. Pulmonic Valve: The pulmonic valve was grossly normal. Pulmonic valve regurgitation is trivial. Aorta: The aortic root and ascending aorta are structurally normal, with no evidence of dilitation. Venous: The inferior vena cava is dilated in size with less than 50% respiratory variability, suggesting right atrial pressure of 15 mmHg. IAS/Shunts: No atrial level shunt detected by color flow Doppler.  LEFT VENTRICLE PLAX 2D LVIDd:         4.60 cm  Diastology LVIDs:         4.20 cm  LV e' lateral:   6.53 cm/s LV PW:         0.90 cm  LV E/e' lateral: 18.2 LV IVS:        0.90 cm LVOT diam:     2.10 cm LV SV:         24 LV SV Index:   18 LVOT Area:     3.46 cm  RIGHT VENTRICLE            IVC RV Basal diam:  3.20 cm    IVC diam: 2.60 cm RV Mid diam:    2.20 cm RV S prime:     7.94 cm/s TAPSE (M-mode): 1.3 cm LEFT ATRIUM             Index       RIGHT ATRIUM           Index LA diam:        3.90 cm 2.94 cm/m  RA Area:     18.80 cm LA Vol (A2C):   61.8 ml 46.60 ml/m RA Volume:   52.00 ml  39.21 ml/m LA Vol (A4C):   47.6 ml 35.89 ml/m LA Biplane Vol: 55.0 ml 41.47 ml/m  AORTIC VALVE LVOT Vmax:   45.40 cm/s LVOT Vmean:  30.700 cm/s LVOT VTI:    0.069 m  AORTA Ao Root diam: 2.80 cm Ao Asc diam:  2.95 cm MITRAL VALVE                TRICUSPID VALVE MV Area (PHT): 5.38 cm     TR Peak grad:   38.9 mmHg MV Decel Time: 141 msec     TR  Vmax:        312.00 cm/s MV E velocity: 119.00 cm/s MV A velocity: 59.60 cm/s   SHUNTS MV E/A ratio:  2.00         Systemic VTI:  0.07 m                             Systemic Diam: 2.10 cm Weston Brass MD Electronically signed by Weston Brass MD Signature Date/Time: 05/21/2020/7:35:21 PM    Final     Patient Profile     79 y.o. female with a history of type 2 diabetes mellitus, psoriasis, hyperlipidemia, right carotid endarterectomy, recent pneumonia, and tobacco abuse presenting with newly documented cardiomyopathy with cardiogenic shock and severe left main/LAD disease.  Assessment & Plan    1.  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  Hemodynamic parameters consistent with cardiogenic shock at right heart catheterization yesterday.  She was seen by the advanced heart failure team and not felt to be a good candidate for advanced  strategies with plan for conservative medical therapy and palliative care/hospice.  2.  Severe left main/ostial LAD disease as well as moderate circumflex and RCA disease.  She is not a good candidate for CABG.  3.  Moderate to severe mitral regurgitation.  4.  COPD with ongoing tobacco abuse.  5.  Apparent dementia and some recent sundowning.  6.  Type 2 diabetes mellitus.  I reviewed the chart including recent cardiac catheterization and echocardiogram results, consultation by Dr. Gala Romney with recommendations, and follow-up by Dr. Cristal Deer.  I spoke with one of the the patient's sons at bedside today.  Continue aspirin and Lipitor.  Start low-dose Lanoxin.  Continue but cut back IV Lasix with plan to transition to oral soon.  Follow-up BMET in a.m.  Systolic blood pressures running high 90s to 120, we might be able to add a low-dose ARB next, but would continue to hold off on beta-blocker in light of low output heart failure.  Signed, Nona Dell,  MD  05/23/2020, 9:03 AM

## 2020-05-23 NOTE — Progress Notes (Signed)
Patient attempting to climb out of bed every 15 to 20 minutes. Appears to be sundowning and is confused, stating that she "needs to get up". Dr. Antionette Char paged regarding this. New orders given. Floor mats in place, bed alarm on, delirium precautions in place, low bed has been ordered. Safety sitter order has been placed. Will continue to monitor.

## 2020-05-23 NOTE — Evaluation (Signed)
Clinical/Bedside Swallow Evaluation Patient Details  Name: Jillian Webb MRN: [id number] Date of Birth: 05/12/1941  Today's Date: 05/23/2020 Time: SLP Start Time (ACUTE ONLY): 0626 SLP Stop Time (ACUTE ONLY): 0910 SLP Time Calculation (min) (ACUTE ONLY): 17 min  Past Medical History:  Past Medical History:  Diagnosis Date   Depression    Diabetes mellitus without complication (HCC)    Hyperlipidemia    Psoriasis    Past Surgical History:  Past Surgical History:  Procedure Laterality Date   ABDOMINAL HYSTERECTOMY     CAROTID ARTERY ANGIOPLASTY     HEMORROIDECTOMY     HPI:  79 yo female admitted 05/20/20 with lower leg edema,SOB - CHF. PH: DM2, psoriasis, HLD, RCEA (20 years ago), lifelong smoker, anxiety, PNA (6/21). CXR = no edema or airspace opacity. CTHead = atrophy, no acute infarct. Per chart, pt experienced coughing with thin liquids April 2021, had pna and now having difficulty with pills. CXR Suspected underlying interstitial fibrosis. No edema or airspace opacity.    Assessment / Plan / Recommendation Clinical Impression  Pt's son present during BSE. Pt and son reported incident in 12/2019 involved pill consumption with Dr. Reino Kent and pt thought it was Coke and "took me by surprise.". No overt signs of aspiration with approximately 1.5 oz (limited by respiratory status) per CXR pt does have suspected underlying interstitial fibrosis increasing risk for infection should she aspirate. Upper partial is loose but in place in masticate solid without difficulty. Education provided to pt and son re: recommendation for pills whole in puree texture, regular diet texture, thin liquids, positioning (pt sundowning with suspected cognitive deficits and will likely have difficulty recalling). Educated that if she has pna in the future or s/s aspiration, an instrumental test would be warranted. ST plans to follow up while in acute.    SLP Visit Diagnosis: Dysphagia, unspecified  (R13.10)    Aspiration Risk  Mild aspiration risk    Diet Recommendation Regular;Thin liquid   Liquid Administration via: Cup;Straw Medication Administration: Whole meds with puree Supervision: Patient able to self feed;Intermittent supervision to cue for compensatory strategies Compensations: Minimize environmental distractions;Slow rate;Small sips/bites Postural Changes: Seated upright at 90 degrees    Other  Recommendations Oral Care Recommendations: Oral care BID   Follow up Recommendations Other (comment) (TBD)      Frequency and Duration min 2x/week  2 weeks       Prognosis Prognosis for Safe Diet Advancement: Good      Swallow Study   General Date of Onset: 05/20/20 HPI: 79 yo female admitted 05/20/20 with lower leg edema,SOB - CHF. PH: DM2, psoriasis, HLD, RCEA (20 years ago), lifelong smoker, anxiety, PNA (6/21). CXR = no edema or airspace opacity. CTHead = atrophy, no acute infarct. Per chart, pt experienced coughing with thin liquids April 2021, had pna and now having difficulty with pills. CXR Suspected underlying interstitial fibrosis. No edema or airspace opacity.  Type of Study: Bedside Swallow Evaluation Previous Swallow Assessment: none found Diet Prior to this Study: Regular;Thin liquids Respiratory Status: Room air History of Recent Intubation: No Behavior/Cognition: Alert;Cooperative;Pleasant mood Oral Cavity Assessment: Within Functional Limits Oral Care Completed by SLP: No Oral Cavity - Dentition: Other (Comment);Dentures, bottom (upper partial-loose) Vision: Functional for self-feeding Self-Feeding Abilities: Able to feed self Patient Positioning: Upright in bed Baseline Vocal Quality: Normal Volitional Cough: Strong Volitional Swallow: Able to elicit    Oral/Motor/Sensory Function Overall Oral Motor/Sensory Function: Within functional limits   Ice Chips Ice chips: Not tested  Thin Liquid Thin Liquid: Within functional limits Presentation:  Cup;Straw    Nectar Thick Nectar Thick Liquid: Not tested   Honey Thick Honey Thick Liquid: Not tested   Puree Puree: Within functional limits   Solid     Solid: Within functional limits      Royce Macadamia 05/23/2020,9:37 AM  Breck Coons Lonell Face.Ed Nurse, children's [phone] Office [phone]

## 2020-05-24 LAB — GLUCOSE, CAPILLARY
Glucose-Capillary: 96 mg/dL (ref 70–99)
Glucose-Capillary: 295 mg/dL — ABNORMAL HIGH (ref 70–99)
Glucose-Capillary: 182 mg/dL — ABNORMAL HIGH (ref 70–99)
Glucose-Capillary: 213 mg/dL — ABNORMAL HIGH (ref 70–99)

## 2020-05-24 LAB — BASIC METABOLIC PANEL
Anion gap: 10 (ref 5–15)
BUN: 17 mg/dL (ref 8–23)
CO2: 30 mmol/L (ref 22–32)
Calcium: 7.8 mg/dL — ABNORMAL LOW (ref 8.9–10.3)
Chloride: 94 mmol/L — ABNORMAL LOW (ref 98–111)
Creatinine, Ser: 0.85 mg/dL (ref 0.44–1.00)
GFR calc Af Amer: >60 mL/min (ref >60)
GFR calc non Af Amer: >60 mL/min (ref >60)
Glucose, Bld: 94 mg/dL (ref 70–99)
Potassium: 3.2 mmol/L — ABNORMAL LOW (ref 3.5–5.1)
Sodium: 134 mmol/L — ABNORMAL LOW (ref 135–145)

## 2020-05-24 MED ORDER — SPIRONOLACTONE 12.5 MG HALF TABLET
12.5000 mg | ORAL_TABLET | Freq: Every day | ORAL | Status: DC
  Administered 2020-05-24 – 2020-05-25 (×2): 12.5 mg via ORAL
  Filled 2020-05-24 (×2): qty 1

## 2020-05-24 MED ORDER — FUROSEMIDE 20 MG PO TABS: 20.0000 mg | ORAL_TABLET | Freq: Every day | ORAL | Status: DC

## 2020-05-24 MED ORDER — POTASSIUM CHLORIDE CRYS ER 20 MEQ PO TBCR
40.0000 meq | EXTENDED_RELEASE_TABLET | Freq: Once | ORAL | Status: AC
  Administered 2020-05-24: 40 meq via ORAL
  Filled 2020-05-24: qty 2

## 2020-05-24 MED ORDER — FUROSEMIDE 40 MG PO TABS
40.0000 mg | ORAL_TABLET | Freq: Every day | ORAL | Status: DC
  Administered 2020-05-25: 40 mg via ORAL
  Filled 2020-05-24: qty 1

## 2020-05-24 NOTE — Progress Notes (Addendum)
Progress Note  [name] Date of Encounter: 05/24/2020  Primary Cardiologist: Jodelle Red, MD  Subjective   No chest pain or breathlessness at rest. She would like to go home. Her son Jillian Webb was visiting this morning.  Inpatient Medications    Scheduled Meds: . (feeding supplement) PROSource Plus  30 mL Oral BID BM  . aspirin  81 mg Oral Daily  . atorvastatin  80 mg Oral Daily  . cyanocobalamin  1,000 mcg Intramuscular Daily  . digoxin  0.125 mg Oral Daily  . furosemide  40 mg Intravenous BID  . heparin  5,000 Units Subcutaneous Q8H  . insulin aspart  0-5 Units Subcutaneous QHS  . insulin aspart  0-9 Units Subcutaneous TID WC  . multivitamin with minerals  1 tablet Oral Daily  . potassium chloride  40 mEq Oral Once  . sertraline  50 mg Oral Daily  . sodium chloride flush  3 mL Intravenous Q12H  . vitamin B-12  100 mcg Oral Daily   Continuous Infusions: . sodium chloride     PRN Meds: sodium chloride, acetaminophen, ondansetron (ZOFRAN) IV, sodium chloride flush   Vital Signs    Vitals:   05/23/20 2004 05/24/20 0100 05/24/20 0110 05/24/20 0729  BP: 100/63 110/63 110/63 (!) 140/50  Pulse:  86 86 88  Resp:   19   Temp: (!) 97.4 F (36.3 C) 97.6 F (36.4 C) 97.6 F (36.4 C) 98.1 F (36.7 C)  TempSrc: Oral Oral Oral Oral  SpO2:    95%  Weight:   43.6 kg   Height:   5' (1.524 m)     Intake/Output Summary (Last 24 hours) at 05/24/2020 0929 Last data filed at 05/24/2020 0100 Gross per 24 hour  Intake 340 ml  Output 1200 ml  Net -860 ml   Filed Weights   05/22/20 0447 05/23/20 0237 05/24/20 0110  Weight: 43.2 kg 43.6 kg 43.6 kg    Telemetry    Sinus rhythm.  Personally reviewed.  ECG    An ECG dated 05/20/2020 was personally reviewed today and demonstrated:  Sinus rhythm with poor R wave progression, rule out old anterior infarct pattern.  Physical Exam   GEN:  Frail-appearing elderly woman, no distress.  Neck: No  JVD. Cardiac: RRR, 2/6 systolic murmur, no gallop.  Respiratory: Nonlabored. Clear to auscultation bilaterally. GI: Soft, nontender, bowel sounds present. MS:  Trace ankle edema; No deformity.  Labs    Chemistry Recent Labs  Lab 05/20/20 1036 05/21/20 0240 05/22/20 0307 05/22/20 1417 05/22/20 1422 05/23/20 0026 05/24/20 0120  NA 135   < > 131*   < > 137  136 136 134*  K 3.3*   < > 3.4*   < > 3.5  3.5 3.6 3.2*  CL 99   < > 98  --   --  97* 94*  CO2 23   < > 22  --   --  26 30  GLUCOSE 283*   < > 255*  --   --  151* 94  BUN 16   < > 19  --   --  19 17  CREATININE 0.86   < > 0.93  --   --  0.93 0.85  CALCIUM 8.6*   < > 8.0*  --   --  8.4* 7.8*  PROT 6.8  --   --   --   --   --   --   ALBUMIN 3.6  --   --   --   --   --   --  AST 20  --   --   --   --   --   --   ALT 16  --   --   --   --   --   --   ALKPHOS 97  --   --   --   --   --   --   BILITOT 1.1  --   --   --   --   --   --   GFRNONAA >60   < > 58*  --   --  58* >60  GFRAA >60   < > >60  --   --  >60 >60  ANIONGAP 13   < > 11  --   --  13 10   < > = values in this interval not displayed.     Hematology Recent Labs  Lab 05/21/20 0240 05/21/20 0240 05/22/20 0307 05/22/20 0307 05/22/20 1417 05/22/20 1422 05/23/20 0026  WBC 6.1  --  6.7  --   --   --  10.2  RBC 4.14  --  4.02  --   --   --  4.30  HGB 9.8*   < > 9.7*   < > 11.2* 11.2*  11.2* 10.5*  HCT 33.5*   < > 32.0*   < > 33.0* 33.0*  33.0* 34.3*  MCV 80.9  --  79.6*  --   --   --  79.8*  MCH 23.7*  --  24.1*  --   --   --  24.4*  MCHC 29.3*  --  30.3  --   --   --  30.6  RDW 18.5*  --  18.3*  --   --   --  18.5*  PLT 324  --  308  --   --   --  313   < > = values in this interval not displayed.    Cardiac Enzymes Recent Labs  Lab 05/20/20 1036 05/20/20 2119 05/20/20 2314  TROPONINIHS 33* 45* 43*    BNP Recent Labs  Lab 05/20/20 1036  BNP 3,975.8*     Radiology    CARDIAC CATHETERIZATION  Result Date: 05/22/2020  Ost LM lesion  is 80% stenosed.  Mid RCA lesion is 70% stenosed.  Prox RCA lesion is 30% stenosed.  Ost Cx lesion is 20% stenosed.  Prox Cx lesion is 50% stenosed.  1st Mrg lesion is 70% stenosed.  Mid LAD lesion is 60% stenosed.  Mid Cx lesion is 20% stenosed.  Severely calcified coronary arteries with 80% calcified ostial/proximal left main stenosis with pressure dampening; calcified LAD with 55 to 60% mid stenosis; 20 and 50% proximal circumflex stenoses with 70% AV groove stenosis; dominant RCA with 30% diffuse irregularity in the mid vessel with 70% stenosis proximal to the acute margin. Echo documentation of EF 20% with grade 3 restrictive physiology, biatrial enlargement, and moderate to severe MR and severe TR. RECOMMENDATION: Patient has severe cardiomyopathy.  Vessels are significantly calcified particularly at the ostium of the left main with at least ostial to proximal 80% left main stenosis.  The patient will be transported from the Cath Lab to 2H.  She may require inotropic therapy and advanced heart failure management and surgical evaluation.  Findings discussed with Dr. Janne Napoleon who is the consulting cardiologist.    Patient Profile     79 y.o. female with a history of type 2 diabetes mellitus, psoriasis, hyperlipidemia, right carotid endarterectomy, recent pneumonia, and tobacco  abuse presenting with newly documented cardiomyopathy with cardiogenic shock and severe left main/LAD disease.  Assessment & Plan    1.  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  Hemodynamic parameters consistent with cardiogenic shock at right heart catheterization yesterday.  She was seen by the advanced heart failure team and not felt to be a good candidate for advanced strategies with plan for conservative medical therapy and palliative care/hospice. She has diuresed further on IV Lasix, leg edema better.  Renal function stable.  2.  Severe left main/ostial LAD disease as well as moderate circumflex and RCA disease.  She is not a good candidate for CABG.  3.  Moderate to severe mitral regurgitation.  4.  COPD with ongoing tobacco abuse.  5.  Apparent dementia and some recent sundowning.  6.  Type 2 diabetes mellitus.  I spoke with the patient's son Jillian Webb in the room today. Plan is for her to go home with hospice care, most likely tomorrow. Would continue aspirin and Lipitor. Change from IV Lasix to Lasix 40 mg daily, start Aldactone 12.5 mg daily. Patient with allergy to Cozaar, will hold off starting ARB or ACE-I. Continue to hold on beta-blocker given low output heart failure. Lanoxin was started yesterday. If blood pressure ultimately tolerates, could consider very low-dose hydralazine/nitrate combination next. Recheck BMET in a.m.  Signed, Nona Dell, MD  05/24/2020, 9:29 AM

## 2020-05-24 NOTE — Plan of Care (Signed)

## 2020-05-24 NOTE — Progress Notes (Signed)
PROGRESS NOTE    Ellington Cornia  FIE:[id number] DOB: [birth year] DOA: 05/20/2020 PCP: Cheron Schaumann., MD   Brief Narrative: 79 year old with past medical history significant for hypertension, diabetes type 2, hyperlipidemia who presents complaining of increasing lower extremity edema shortness of breath, panic attack.  Evaluation in the ED patient was tachycardic, tachypneic, CT abdomen and pelvis show borderline cardiomegaly with a small right greater than left pleural effusion.  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  Elevated BNP at 3000, initial troponin 33, lactic acid 2.4.  Patient admitted for evaluation of heart failure.  Assessment & Plan:   Principal Problem:   Acute CHF (congestive heart failure) (HCC) Active Problems:   Fluid overload   Hypokalemia   Elevated troponin   Lactic acidosis   Lesion of adrenal gland (HCC)   Type 2 diabetes mellitus with hyperlipidemia (HCC)   HTN (hypertension)   Superior mesenteric artery atherosclerosis (HCC)   Protein-calorie malnutrition, severe   1-New Systolic and diastolic Heart failure; Mixed ischemic/ non ischemic cardiomyopathy  Echo Showed Ef 20 %, diastolic dysfunction grade 3, mitral valve regurgitation.  Presents with dyspnea, lower extremity edema, elevated BNP. Treated  IV Lasix 20 mg IV twice daily Urine out put not documented.  Strict I and O. Daily weight.  -Cardiology consulted. Underwent Heart cath; severely calcified coronary arteries with 80% calcified ostial/proximal left main stenosis, calcified LAD with 55 to 60% mid stenosis, 20-50 proximal circumflex stenosis with 70% AV groove stenosis dominant RCA with 30% diffuse irregularity in the mid vessel with 70% stenosis proximal to the acute margin.  -Evaluated by heart failure team, due to patient comorbidities and cachexia/Faility she is not a candidate for advanced therapy.  They were recommending  palliative care hospice care. -not good candidate for CABG.  -appreciate cardiology evaluation.  -transition to oral lasix and spironolactone.   2-Mild elevation of troponin, Demand ischemia.    3-Lactic acidosis; from decreased perfusion.    4-Severe stenosis of proximal SMA: Agree with increase statins dose.  LDL 141. Discussed with Dr Randie Heinz, CT finding likely SMA. Continue with statins. Heart problem take precedents.    5-Left adrenal lesion: She will need follow-up CT scan in a year.  She will also need hormone work-up as an outpatient  6-Type 2 diabetes: Continue with a sliding scale insulin Hypomagnesemia; replete IV>  Repeat labs in am.   Normocytic anemia: B12 low at 123 start intramuscular supplementation. Hypokalemia; replete with oral potasium times 2.  Dyspa Tobacco use: Cessation encouraged Acute Metabolic encephalopathy, Delirium; was more confuse overnight.  Delirium precaution./   Estimated body mass index is 18.77 kg/m as calculated from the following:   Height as of this encounter: 5' (1.524 m).   Weight as of this encounter: 43.6 kg.   DVT prophylaxis: Lovenox Code Status: DNR Family Communication:  Disposition Plan:  Status is: Inpatient  Remains inpatient appropriate because:Ongoing diagnostic testing needed not appropriate for outpatient work up   Dispo: The patient is from: Home              Anticipated d/c is to: Home              Anticipated d/c date is: 2 days              Patient currently is not medically stable to d/c.Home tomorrow with hospice.         Consultants:   none  Procedures:   Echo pending  Antimicrobials:    Subjective: She is alert, confuse. Denies dyspnea.   Objective: Vitals:   05/23/20 1825 05/23/20 2004 05/24/20 0100 05/24/20 0110  BP: (!) 88/48 100/63 110/63 110/63  Pulse: 98  86 86  Resp: 19   19  Temp: 98.5 F (36.9 C) (!) 97.4 F (36.3 C) 97.6 F (36.4 C) 97.6 F (36.4 C)  TempSrc:  Tympanic Oral Oral Oral  SpO2:      Weight:    43.6 kg  Height:    5' (1.524 m)    Intake/Output Summary (Last 24 hours) at 05/24/2020 0646 Last data filed at 05/24/2020 0100 Gross per 24 hour  Intake 580 ml  Output 1500 ml  Net -920 ml   Filed Weights   05/22/20 0447 05/23/20 0237 05/24/20 0110  Weight: 43.2 kg 43.6 kg 43.6 kg    Examination:  General exam: NAD Respiratory system: B/L crackles.  Cardiovascular system: S 1, S 2 RRR Gastrointestinal system: BS present, soft, nt Central nervous system: Alert, confuse Extremities: Trace edema   Data Reviewed: I have personally reviewed following labs and imaging studies  CBC: Recent Labs  Lab 05/20/20 1036 05/20/20 1036 05/21/20 0240 05/22/20 0307 05/22/20 1417 05/22/20 1422 05/23/20 0026  WBC 6.6  --  6.1 6.7  --   --  10.2  NEUTROABS 5.4  --   --   --   --   --   --   HGB 11.1*   < > 9.8* 9.7* 11.2* 11.2*  11.2* 10.5*  HCT 37.7   < > 33.5* 32.0* 33.0* 33.0*  33.0* 34.3*  MCV 81.1  --  80.9 79.6*  --   --  79.8*  PLT 367  --  324 308  --   --  313   < > = values in this interval not displayed.   Basic Metabolic Panel: Recent Labs  Lab 05/20/20 1036 05/20/20 1036 05/21/20 0240 05/21/20 0240 05/22/20 0307 05/22/20 1417 05/22/20 1422 05/23/20 0026 05/24/20 0120  NA 135   < > 136   < > 131* 136 137  136 136 134*  K 3.3*   < > 4.3   < > 3.4* 3.5 3.5  3.5 3.6 3.2*  CL 99  --  101  --  98  --   --  97* 94*  CO2 23  --  23  --  22  --   --  26 30  GLUCOSE 283*  --  133*  --  255*  --   --  151* 94  BUN 16  --  16  --  19  --   --  19 17  CREATININE 0.86  --  1.00  --  0.93  --   --  0.93 0.85  CALCIUM 8.6*  --  8.3*  --  8.0*  --   --  8.4* 7.8*  MG  --   --   --   --  1.1*  --   --  1.7  --    < > = values in this interval not displayed.   GFR: Estimated Creatinine Clearance: 36.9 mL/min (by C-G formula based on SCr of 0.85 mg/dL). Liver Function Tests: Recent Labs  Lab 05/20/20 1036  AST 20  ALT  16  ALKPHOS 97  BILITOT 1.1  PROT 6.8  ALBUMIN 3.6   Recent Labs  Lab 05/20/20 1036  LIPASE 24   No results for input(s): AMMONIA in the last 168 hours. Coagulation  Profile: No results for input(s): INR, PROTIME in the last 168 hours. Cardiac Enzymes: No results for input(s): CKTOTAL, CKMB, CKMBINDEX, TROPONINI in the last 168 hours. BNP (last 3 results) No results for input(s): PROBNP in the last 8760 hours. HbA1C: No results for input(s): HGBA1C in the last 72 hours. CBG: Recent Labs  Lab 05/23/20 0626 05/23/20 1056 05/23/20 1607 05/23/20 2121 05/24/20 0626  GLUCAP 126* 342* 201* 243* 96   Lipid Profile: No results for input(s): CHOL, HDL, LDLCALC, TRIG, CHOLHDL, LDLDIRECT in the last 72 hours. Thyroid Function Tests: No results for input(s): TSH, T4TOTAL, FREET4, T3FREE, THYROIDAB in the last 72 hours. Anemia Panel: No results for input(s): VITAMINB12, FOLATE, FERRITIN, TIBC, IRON, RETICCTPCT in the last 72 hours. Sepsis Labs: Recent Labs  Lab 05/20/20 1116 05/20/20 1345 05/22/20 1233  LATICACIDVEN 2.4* 2.6* 3.8*    Recent Results (from the past 240 hour(s))  Blood culture (routine x 2)     Status: None (Preliminary result)   Collection Time: 05/20/20 10:36 AM   Specimen: BLOOD  Result Value Ref Range Status   Specimen Description   Final    BLOOD RIGHT ANTECUBITAL Performed at [facility], [address] [place], [place] [address]    Special Requests   Final    BOTTLES DRAWN AEROBIC AND ANAEROBIC Blood Culture adequate volume Performed at [facility], [address] [place], [place] [address]    Culture   Final    NO GROWTH 3 DAYS Performed at [facility], [address], [place], [place] [address]    Report Status PENDING  Incomplete  SARS Coronavirus 2 by RT PCR (hospital order, performed in [facility] hospital lab) Nasopharyngeal Nasopharyngeal Swab     Status: None   Collection Time: 05/20/20 11:16 AM    Specimen: Nasopharyngeal Swab  Result Value Ref Range Status   SARS Coronavirus 2 NEGATIVE NEGATIVE Final    Comment: (NOTE) SARS-CoV-2 target nucleic acids are NOT DETECTED.  The SARS-CoV-2 RNA is generally detectable in upper and lower respiratory specimens during the acute phase of infection. The lowest concentration of SARS-CoV-2 viral copies this assay can detect is 250 copies / mL. A negative result does not preclude SARS-CoV-2 infection and should not be used as the sole basis for treatment or other patient management decisions.  A negative result may occur with improper specimen collection / handling, submission of specimen other than nasopharyngeal swab, presence of viral mutation(s) within the areas targeted by this assay, and inadequate number of viral copies (<250 copies / mL). A negative result must be combined with clinical observations, patient history, and epidemiological information.  Fact Sheet for Patients:   BoilerBrush.com.cy  Fact Sheet for Healthcare Providers: https://pope.com/  This test is not yet approved or  cleared by the [place] FDA and has been authorized for detection and/or diagnosis of SARS-CoV-2 by FDA under an Emergency Use Authorization (EUA).  This EUA will remain in effect (meaning this test can be used) for the duration of the COVID-19 declaration under Section 564(b)(1) of the Act, 21 U.S.C. section 360bbb-3(b)(1), unless the authorization is terminated or revoked sooner.  Performed at [facility], [address] [place], [place] [address]   Blood culture (routine x 2)     Status: None (Preliminary result)   Collection Time: 05/20/20 11:16 AM   Specimen: BLOOD RIGHT FOREARM  Result Value Ref Range Status   Specimen Description   Final  BLOOD RIGHT FOREARM Performed at [facility] [place], [address] [place], [place] [address]    Special Requests   Final     BOTTLES DRAWN AEROBIC AND ANAEROBIC Blood Culture adequate volume Performed at [facility] [place], [address] [place], [place] [address]    Culture   Final    NO GROWTH 3 DAYS Performed at [facility], [address] [place], [place] [address]    Report Status PENDING  Incomplete  Urine culture     Status: Abnormal   Collection Time: 05/20/20  1:43 PM   Specimen: Urine, Clean Catch  Result Value Ref Range Status   Specimen Description   Final    URINE, CLEAN CATCH Performed at [facility] [place], [address] [place], [place] [address]    Special Requests   Final    NONE Performed at [facility] [place], [address] [place], [place] [address]    Culture MULTIPLE SPECIES PRESENT, SUGGEST RECOLLECTION (A)  Final   Report Status 05/21/2020 FINAL  Final  MRSA PCR Screening     Status: None   Collection Time: 05/22/20  8:30 PM   Specimen: Nasal Mucosa; Nasopharyngeal  Result Value Ref Range Status   MRSA by PCR NEGATIVE NEGATIVE Final    Comment:        The GeneXpert MRSA Assay (FDA approved for NASAL specimens only), is one component of a comprehensive MRSA colonization surveillance program. It is not intended to diagnose MRSA infection nor to guide or monitor treatment for MRSA infections. Performed at [facility], [address] [place], [place] [address]          Radiology Studies: CARDIAC CATHETERIZATION  Result Date: 05/22/2020  Suezanne JacquetOst LM lesion is 80% stenosed.  Mid RCA lesion is 70% stenosed.  Prox RCA lesion is 30% stenosed.  Ost Cx lesion is 20% stenosed.  Prox Cx lesion is 50% stenosed.  1st Mrg lesion is 70% stenosed.  Mid LAD lesion is 60% stenosed.  Mid Cx lesion is 20% stenosed.  Severely calcified coronary arteries with 80% calcified ostial/proximal left main stenosis with pressure dampening; calcified LAD with 55 to 60% mid stenosis; 20 and 50% proximal circumflex stenoses with 70% AV groove stenosis; dominant  RCA with 30% diffuse irregularity in the mid vessel with 70% stenosis proximal to the acute margin. Echo documentation of EF 20% with grade 3 restrictive physiology, biatrial enlargement, and moderate to severe MR and severe TR. RECOMMENDATION: Patient has severe cardiomyopathy.  Vessels are significantly calcified particularly at the ostium of the left main with at least ostial to proximal 80% left main stenosis.  The patient will be transported from the Cath Lab to 2H.  She may require inotropic therapy and advanced heart failure management and surgical evaluation.  Findings discussed with Dr. Janne NapoleonBridget Christopher who is the consulting cardiologist.        Scheduled Meds: . (feeding supplement) PROSource Plus  30 mL Oral BID BM  . aspirin  81 mg Oral Daily  . atorvastatin  80 mg Oral Daily  . cyanocobalamin  1,000 mcg Intramuscular Daily  . digoxin  0.125 mg Oral Daily  . furosemide  40 mg Intravenous BID  . heparin  5,000 Units Subcutaneous Q8H  . insulin aspart  0-5 Units Subcutaneous QHS  . insulin aspart  0-9 Units Subcutaneous TID WC  . multivitamin with minerals  1 tablet Oral Daily  . potassium  chloride  40 mEq Oral Once  . sertraline  50 mg Oral Daily  . sodium chloride flush  3 mL Intravenous Q12H  . vitamin B-12  100 mcg Oral Daily   Continuous Infusions: . sodium chloride       LOS: 4 days    Time spent: 35 minutes    Kenneth Lax A Rosela Supak, MD [facility]   If 7PM-7AM, please contact night-coverage [email or web address]  05/24/2020, 6:46 AM

## 2020-05-24 NOTE — Progress Notes (Signed)
Reviewed chart. Discussed with Dr. Sunnie Nielsen.  Plans are to DC to home with Hospice.    [facility] Palliative Medicine Team is available if called.  No need to see today.  Norvel Richards, PA-C Palliative Medicine Office:  [phone]

## 2020-05-25 ENCOUNTER — Encounter (HOSPITAL_COMMUNITY): Payer: Self-pay | Admitting: Cardiovascular Disease

## 2020-05-25 DIAGNOSIS — I509 Heart failure, unspecified: Secondary | ICD-10-CM

## 2020-05-25 DIAGNOSIS — R570 Cardiogenic shock: Secondary | ICD-10-CM

## 2020-05-25 LAB — CULTURE, BLOOD (ROUTINE X 2)
Culture: NO GROWTH
Culture: NO GROWTH
Special Requests: ADEQUATE
Special Requests: ADEQUATE

## 2020-05-25 LAB — BASIC METABOLIC PANEL
Anion gap: 11 (ref 5–15)
BUN: 17 mg/dL (ref 8–23)
CO2: 29 mmol/L (ref 22–32)
Calcium: 7.8 mg/dL — ABNORMAL LOW (ref 8.9–10.3)
Chloride: 95 mmol/L — ABNORMAL LOW (ref 98–111)
Creatinine, Ser: 0.93 mg/dL (ref 0.44–1.00)
GFR calc Af Amer: >60 mL/min (ref >60)
GFR calc non Af Amer: 58 mL/min — ABNORMAL LOW (ref >60)
Glucose, Bld: 139 mg/dL — ABNORMAL HIGH (ref 70–99)
Potassium: 3.6 mmol/L (ref 3.5–5.1)
Sodium: 135 mmol/L (ref 135–145)

## 2020-05-25 LAB — GLUCOSE, CAPILLARY: Glucose-Capillary: 170 mg/dL — ABNORMAL HIGH (ref 70–99)

## 2020-05-25 MED ORDER — CYANOCOBALAMIN 100 MCG PO TABS: 100.0000 ug | ORAL_TABLET | Freq: Every day | ORAL | 0 refills | Status: AC

## 2020-05-25 MED ORDER — FUROSEMIDE 40 MG PO TABS: 40.0000 mg | ORAL_TABLET | Freq: Every day | ORAL | 2 refills | Status: AC

## 2020-05-25 MED ORDER — ALPRAZOLAM 0.25 MG PO TABS: 0.2500 mg | ORAL_TABLET | Freq: Two times a day (BID) | ORAL | 0 refills | Status: AC | PRN

## 2020-05-25 MED ORDER — ADULT MULTIVITAMIN W/MINERALS CH: 1.0000 | ORAL_TABLET | Freq: Every day | ORAL | 0 refills | Status: AC

## 2020-05-25 MED ORDER — ASPIRIN 81 MG PO CHEW: 81.0000 mg | CHEWABLE_TABLET | Freq: Every day | ORAL | 0 refills | Status: AC

## 2020-05-25 MED ORDER — SPIRONOLACTONE 25 MG PO TABS: 12.5000 mg | ORAL_TABLET | Freq: Every day | ORAL | 2 refills | Status: AC

## 2020-05-25 MED ORDER — ATORVASTATIN CALCIUM 80 MG PO TABS: 80.0000 mg | ORAL_TABLET | Freq: Every day | ORAL | 2 refills | Status: AC

## 2020-05-25 MED ORDER — DIGOXIN 125 MCG PO TABS: 0.1250 mg | ORAL_TABLET | Freq: Every day | ORAL | 3 refills | Status: AC

## 2020-05-25 NOTE — Progress Notes (Signed)
Physical Therapy Treatment Patient Details Name: Jillian Webb MRN: [id number] DOB: Feb 20, 1941 Today's Date: 05/25/2020    History of Present Illness pt is 79 yo female who presents to the ED with LE edema, fatigue, weakness, abdominal pain and decreased oral intake. Pt is currently being treated for new onset acute CHF. Pt had only mildly elevated troponin levels. Pt has a PMH of HTN, HLD, T2DM, and SMA stenosis    PT Comments    Pt pleasant and eager to get OOB and return home. Pt with significantly improved gait tolerance with minor need for hand held assist to steady. Pt educated for safety with mobility, continued gait and HEP. Pt able to name the president but could only think of 2/3 animals that start with c.  HR 91-93 BP 100/82 prior to activity and 134/62 after    Follow Up Recommendations  Home health PT;Supervision for mobility/OOB     Equipment Recommendations  None recommended by PT    Recommendations for Other Services       Precautions / Restrictions Precautions Precautions: Fall    Mobility  Bed Mobility Overal bed mobility: Modified Independent                Transfers Overall transfer level: Modified independent                  Ambulation/Gait Ambulation/Gait assistance: Min assist Gait Distance (Feet): 160 Feet Assistive device: 1 person hand held assist Gait Pattern/deviations: Step-through pattern;Narrow base of support;Decreased stride length   Gait velocity interpretation: >2.62 ft/sec, indicative of community ambulatory General Gait Details: limited HHA with cues for direction, slow cautious gait   Stairs             Wheelchair Mobility    Modified Rankin (Stroke Patients Only)       Balance Overall balance assessment: Needs assistance   Sitting balance-Leahy Scale: Good       Standing balance-Leahy Scale: Good                              Cognition Arousal/Alertness: Awake/alert Behavior  During Therapy: WFL for tasks assessed/performed Overall Cognitive Status: Impaired/Different from baseline Area of Impairment: Memory;Safety/judgement                     Memory: Decreased short-term memory   Safety/Judgement: Decreased awareness of deficits            Exercises General Exercises - Lower Extremity Long Arc Quad: AROM;Both;Seated;15 reps Hip Flexion/Marching: AROM;Both;15 reps;Seated    General Comments        Pertinent Vitals/Pain Pain Assessment: No/denies pain    Home Living                      Prior Function            PT Goals (current goals can now be found in the care plan section) Progress towards PT goals: Progressing toward goals    Frequency    Min 3X/week      PT Plan Current plan remains appropriate    Co-evaluation              AM-PAC PT "6 Clicks" Mobility   Outcome Measure  Help needed turning from your back to your side while in a flat bed without using bedrails?: None Help needed moving from lying on your back to sitting on the side of a  flat bed without using bedrails?: None Help needed moving to and from a bed to a chair (including a wheelchair)?: None Help needed standing up from a chair using your arms (e.g., wheelchair or bedside chair)?: None Help needed to walk in hospital room?: A Little Help needed climbing 3-5 steps with a railing? : A Little 6 Click Score: 22    End of Session   Activity Tolerance: Patient tolerated treatment well Patient left: in chair;with call bell/phone within reach;with chair alarm set Nurse Communication: Mobility status PT Visit Diagnosis: Unsteadiness on feet (R26.81);Muscle weakness (generalized) (M62.81)     Time: 2951-8841 PT Time Calculation (min) (ACUTE ONLY): 17 min  Charges:  $Gait Training: 8-22 mins                     Merryl Hacker, PT Acute Rehabilitation Services Pager: [phone] Office: [phone]    Enedina Finner Kayle Passarelli 05/25/2020, 12:44  PM

## 2020-05-25 NOTE — Progress Notes (Signed)
Discharged instructions given to pt/son. Belongings taken home.prescription given to pt.

## 2020-05-25 NOTE — TOC Transition Note (Signed)
Transition of Care [facility]) - CM/SW Discharge Note   Patient Details  Name: Jillian Webb MRN: [id number] Date of Birth: 1941-02-06  [facility] CM/SW Contact:  Leone Haven, RN Phone Number: 05/25/2020, 10:03 AM   Clinical Narrative:    NCM notified Jillian Webb with [facility] that patient is for dc today and that Jillian Webb her son will be transporting her home.  NCM confirmed with Jillian Webb that he will be transporting her home.  Per Jillian Webb they do not need any DME , they have all the DME they need.    Final next level of care: Home w Hospice Care Barriers to Discharge: No Barriers Identified   Patient Goals and CMS Choice Patient states their goals for this hospitalization and ongoing recovery are:: home with hospice CMS Medicare.gov Compare Post Acute Care list provided to:: Other (Comment Required) Choice offered to / list presented to : Adult Children  Discharge Placement                       Discharge Plan and Services                  DME Agency: NA         HH Agency: [facility] Date HH Agency Contacted: 05/25/20 Time HH Agency Contacted: 1002 Representative spoke with at [facility] Agency: Jillian Webb  Social Determinants of Health (SDOH) Interventions     Readmission Risk Interventions No flowsheet data found.

## 2020-05-25 NOTE — Progress Notes (Signed)
Progress Note  [name] Date of Encounter: 05/25/2020  [facility] Cardiologist: Jodelle Red, MD   Subjective   Feeling well.  Has not had any chest pain and breathing is stable.  She has not been out of the bed.  Inpatient Medications    Scheduled Meds: . (feeding supplement) PROSource Plus  30 mL Oral BID BM  . aspirin  81 mg Oral Daily  . atorvastatin  80 mg Oral Daily  . digoxin  0.125 mg Oral Daily  . furosemide  40 mg Oral Daily  . heparin  5,000 Units Subcutaneous Q8H  . insulin aspart  0-5 Units Subcutaneous QHS  . insulin aspart  0-9 Units Subcutaneous TID WC  . multivitamin with minerals  1 tablet Oral Daily  . sertraline  50 mg Oral Daily  . sodium chloride flush  3 mL Intravenous Q12H  . spironolactone  12.5 mg Oral Daily  . vitamin B-12  100 mcg Oral Daily   Continuous Infusions: . sodium chloride     PRN Meds: sodium chloride, acetaminophen, ondansetron (ZOFRAN) IV, sodium chloride flush   Vital Signs    Vitals:   05/24/20 1537 05/24/20 1914 05/24/20 2255 05/25/20 0339  BP: (!) 96/53 (!) 107/51 (!) 112/54 133/61  Pulse: 98 87 85 89  Resp: 18 16 19 19   Temp: 97.7 F (36.5 C) 97.8 F (36.6 C) 98.4 F (36.9 C) 98.2 F (36.8 C)  TempSrc: Oral Oral Oral Oral  SpO2: 97% 97% 95% 96%  Weight:      Height:        Intake/Output Summary (Last 24 hours) at 05/25/2020 0759 Last data filed at 05/24/2020 1700 Gross per 24 hour  Intake 520 ml  Output --  Net 520 ml   Last 3 Weights 05/24/2020 05/23/2020 05/22/2020  Weight (lbs) 96 lb 1.9 oz 96 lb 1.9 oz 95 lb 3.8 oz  Weight (kg) 43.6 kg 43.6 kg 43.2 kg      Telemetry    Sinus rhythm..  PVCs.  4 beats NSVT.- Personally Reviewed  ECG    n/a - Personally Reviewed  Physical Exam   VS:  BP 100/82   Pulse 84   Temp 98.4 F (36.9 C) (Oral)   Resp (!) 22   Ht 5' (1.524 m)   Wt 43.6 kg   SpO2 93%   BMI 18.77 kg/m  , BMI Body mass index is 18.77 kg/m. GENERAL:  Frail elderly woman.  No acute distress. HEENT: Pupils equal round and reactive, fundi not visualized, oral mucosa unremarkable NECK:  No jugular venous distention, waveform within normal limits, carotid upstroke brisk and symmetric, no bruits LUNGS:  Clear to auscultation bilaterally HEART:  RRR.  PMI not displaced or sustained,S1 and S2 within normal limits, no S3, no S4, no clicks, no rubs, no murmurs ABD:  Flat, positive bowel sounds normal in frequency in pitch, no bruits, no rebound, no guarding, no midline pulsatile mass, no hepatomegaly, no splenomegaly EXT:  2 plus pulses throughout, no edema, no cyanosis no clubbing SKIN:  No rashes no nodules.  R groin ecchymosis.  Small hematoma NEURO:  Cranial nerves II through XII grossly intact, motor grossly intact throughout PSYCH:  Cognitively intact, oriented to person place and time   Labs    High Sensitivity Troponin:   Recent Labs  Lab 05/20/20 1036 05/20/20 2119 05/20/20 2314  TROPONINIHS 33* 45* 43*      Chemistry Recent Labs  Lab 05/20/20 1036 05/21/20 0240  05/23/20 0026 05/24/20 0120 05/25/20 0055  NA 135   < > 136 134* 135  K 3.3*   < > 3.6 3.2* 3.6  CL 99   < > 97* 94* 95*  CO2 23   < > 26 30 29   GLUCOSE 283*   < > 151* 94 139*  BUN 16   < > 19 17 17   CREATININE 0.86   < > 0.93 0.85 0.93  CALCIUM 8.6*   < > 8.4* 7.8* 7.8*  PROT 6.8  --   --   --   --   ALBUMIN 3.6  --   --   --   --   AST 20  --   --   --   --   ALT 16  --   --   --   --   ALKPHOS 97  --   --   --   --   BILITOT 1.1  --   --   --   --   GFRNONAA >60   < > 58* >60 58*  GFRAA >60   < > >60 >60 >60  ANIONGAP 13   < > 13 10 11    < > = values in this interval not displayed.     Hematology Recent Labs  Lab 05/21/20 0240 05/21/20 0240 05/22/20 0307 05/22/20 0307 05/22/20 1417 05/22/20 1422 05/23/20 0026  WBC 6.1  --  6.7  --   --   --  10.2  RBC 4.14  --  4.02  --   --   --  4.30  HGB 9.8*   < > 9.7*   < > 11.2* 11.2*  11.2* 10.5*    HCT 33.5*   < > 32.0*   < > 33.0* 33.0*  33.0* 34.3*  MCV 80.9  --  79.6*  --   --   --  79.8*  MCH 23.7*  --  24.1*  --   --   --  24.4*  MCHC 29.3*  --  30.3  --   --   --  30.6  RDW 18.5*  --  18.3*  --   --   --  18.5*  PLT 324  --  308  --   --   --  313   < > = values in this interval not displayed.    BNP Recent Labs  Lab 05/20/20 1036  BNP 3,975.8*     DDimer No results for input(s): DDIMER in the last 168 hours.   Radiology    No results found.  Cardiac Studies   Echo 05/21/20:  1. Left ventricular ejection fraction, by estimation, is 20%. The left  ventricle has severely decreased function. The left ventricle demonstrates  global hypokinesis. Left ventricular diastolic parameters are consistent  with Grade III diastolic  dysfunction (restrictive).  2. Right ventricular systolic function is mildly reduced. The right  ventricular size is mildly enlarged. There is moderately elevated  pulmonary artery systolic pressure. The estimated right ventricular  systolic pressure is 53.9 mmHg.  3. Left atrial size was moderately dilated.  4. Right atrial size was moderately dilated.  5. The mitral valve is degenerative. Moderate to severe mitral valve  regurgitation. No evidence of mitral stenosis.  6. Tricuspid valve regurgitation is severe.  7. The aortic valve is tricuspid. Aortic valve regurgitation is not  visualized. No aortic stenosis is present.  8. The inferior vena cava is dilated in size with <50% respiratory  variability, suggesting right atrial pressure of 15 mmHg.   LHC/RHC 05/22/20:   Ost LM lesion is 80% stenosed.  Mid RCA lesion is 70% stenosed.  Prox RCA lesion is 30% stenosed.  Ost Cx lesion is 20% stenosed.  Prox Cx lesion is 50% stenosed.  1st Mrg lesion is 70% stenosed.  Mid LAD lesion is 60% stenosed.  Mid Cx lesion is 20% stenosed.   Severely calcified coronary arteries with 80% calcified ostial/proximal left main stenosis  with pressure dampening; calcified LAD with 55 to 60% mid stenosis; 20 and 50% proximal circumflex stenoses with 70% AV groove stenosis; dominant RCA with 30% diffuse irregularity in the mid vessel with 70% stenosis proximal to the acute margin.  Echo documentation of EF 20% with grade 3 restrictive physiology, biatrial enlargement, and moderate to severe MR and severe TR.  RECOMMENDATION: Patient has severe cardiomyopathy.  Vessels are significantly calcified particularly at the ostium of the left main with at least ostial to proximal 80% left main stenosis.  The patient will be transported from the Cath Lab to 2H.  She may require inotropic therapy and advanced heart failure management and surgical evaluation.  Findings discussed with Dr. Janne Napoleon who is the consulting cardiologist.   Patient Profile     79 y.o. female with diabetes, hyperlipidemia, status post carotid endarterectomy, tobacco abuse admitted with new acute systolic and diastolic heart failure and cardiogenic shock.  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  Assessment & Plan    # Acute systolic and diastolic heart failure:  LVEF newly 20% this admission.  She has biventricular failure and restrictive cardiomyopathy.  Right heart cath revealed cardiogenic shock.  She has been evaluated by the advanced heart failure team and not thought to be a good candidate for inotropes or CABG.  Plan for conservative medical therapy with palliative care/hospice.  Volume status improved with diuresis.  Renal function remains stable.  Plan for her to discharge home today with hospice.  Continue aspirin, atorvastatin, spironolactone, and digoxin.  Consider having a digoxin level checked later this week.  No ACE-I/ARB due to losartan rash.  No beta blocker given cardiogenic shock.  Consider hydralazine/nitrates if BP allows, though it remains too low for now.  # Severe LM/ostial LAD disease:  # Moderate RCA/LCX: Medical management  as above.  Not a CABG candidate.  # Moderate to severe MR:  May be ischemic.  Continue lasix.  # COPD:  Supportive care.  She is on room air.  [facility] will sign off.   Medication Recommendations: We will add nitroglycerin as needed for chest pain.  Other recommendations (labs, testing, etc): Consider checking a digoxin level later this week Follow up as an outpatient: As needed  For questions or updates, please contact [facility] Please consult [email or web address] for contact info under        Signed, Chilton Si, MD  05/25/2020, 7:59 AM

## 2020-05-25 NOTE — Discharge Summary (Signed)
Physician Discharge Summary  Kaitlin Alcindor QIW:[id number] DOB: 1941/06/27 DOA: 05/20/2020  PCP: Cheron Schaumann., MD  Admit date: 05/20/2020 Discharge date: 05/25/2020  Admitted From: Home  Disposition:  Home with hospice.   Recommendations for Outpatient Follow-up:  1. Follow up with PCP in 1-2 weeks 2. Please obtain BMP/CBC in one week 3. Home with hospice.  4. Follow up with  cardiology as needed.  5. Check digoxin level next week    Discharge Condition: Stable.  CODE STATUS: DNR Diet recommendation: Heart Healthy   Brief/Interim Summary: 79 year old with past medical history significant for hypertension, diabetes type 2, hyperlipidemia who presents complaining of increasing lower extremity edema shortness of breath, panic attack.  Evaluation in the ED patient was tachycardic, tachypneic, CT abdomen and pelvis show borderline cardiomegaly with a small right greater than left pleural effusion.  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  Elevated BNP at 3000, initial troponin 33, lactic acid 2.4.  Patient admitted for evaluation of heart failure.   1-New Systolic and diastolic Heart failure; Mixed ischemic/ non ischemic cardiomyopathy  Echo Showed Ef 20 %, diastolic dysfunction grade 3, mitral valve regurgitation.  Presents with dyspnea, lower extremity edema, elevated BNP. Treated  IV Lasix 20 mg IV twice daily Urine out put not documented.  Strict I and O. Daily weight.  -Cardiology consulted. Underwent Heart cath; severely calcified coronary arteries with 80% calcified ostial/proximal left main stenosis, calcified LAD with 55 to 60% mid stenosis, 20-50 proximal circumflex stenosis with 70% AV groove stenosis dominant RCA with 30% diffuse irregularity in the mid vessel with 70% stenosis proximal to the acute margin.  -Evaluated by heart failure team, due to patient comorbidities and cachexia/Faility she is not a  candidate for advanced therapy.  They were recommending palliative care hospice care. -not good candidate for CABG.  -appreciate cardiology evaluation.  -transition to oral lasix and spironolactone.  -need digoxin level next week.   2-Mild elevation of troponin, Demand ischemia.    3-Lactic acidosis; from decreased perfusion.    4-Severe stenosis of proximal SMA: Agree with increase statins dose.  LDL 141. Discussed with Dr Randie Heinz, CT finding likely SMA. Continue with statins. Heart problem take precedents.    5-Left adrenal lesion: She will need follow-up CT scan in a year.  She will also need hormone work-up as an outpatient  6-Type 2 diabetes: Continue with a sliding scale insulin Hypomagnesemia; replaced.   Normocytic anemia: B12 low at 123 started intramuscular supplementation. Discharge home on oral supplement.  Hypokalemia; replete with oral potasium times 2.  Dyspa Tobacco use: Cessation encouraged  Acute Metabolic encephalopathy, Delirium; was more confuse overnight.  Delirium precaution./   Discharge Diagnoses:  Principal Problem:   Acute CHF (congestive heart failure) (HCC) Active Problems:   Fluid overload   Hypokalemia   Elevated troponin   Lactic acidosis   Lesion of adrenal gland (HCC)   Type 2 diabetes mellitus with hyperlipidemia (HCC)   HTN (hypertension)   Superior mesenteric artery atherosclerosis (HCC)   Protein-calorie malnutrition, severe    Discharge Instructions  Discharge Instructions    Diet - low sodium heart healthy   Complete by: As directed    Increase activity slowly   Complete by: As directed      Allergies as of 05/25/2020      Reactions   Losartan Rash   erythroderma   Sulfa Antibiotics Itching, Rash      Medication List  STOP taking these medications   rosuvastatin 10 MG tablet Commonly known as: CRESTOR     TAKE these medications   ALPRAZolam 0.25 MG tablet Commonly known as: Xanax Take 1 tablet (0.25  mg total) by mouth 2 (two) times daily as needed for anxiety.   aspirin 81 MG chewable tablet Chew 1 tablet (81 mg total) by mouth daily.   atorvastatin 80 MG tablet Commonly known as: LIPITOR Take 1 tablet (80 mg total) by mouth daily.   cyanocobalamin 100 MCG tablet Take 1 tablet (100 mcg total) by mouth daily.   digoxin 0.125 MG tablet Commonly known as: LANOXIN Take 1 tablet (0.125 mg total) by mouth daily.   furosemide 40 MG tablet Commonly known as: LASIX Take 1 tablet (40 mg total) by mouth daily.   gabapentin 100 MG capsule Commonly known as: NEURONTIN Take 300 mg by mouth at bedtime.   glipiZIDE 5 MG tablet Commonly known as: GLUCOTROL Take 5 mg by mouth daily.   metFORMIN 1000 MG tablet Commonly known as: GLUCOPHAGE Take 1,000 mg by mouth 2 (two) times daily with a meal.   multivitamin with minerals Tabs tablet Take 1 tablet by mouth daily.   sertraline 50 MG tablet Commonly known as: ZOLOFT Take 50 mg by mouth daily.   spironolactone 25 MG tablet Commonly known as: ALDACTONE Take 0.5 tablets (12.5 mg total) by mouth daily.       Follow-up Information    [facility] Follow up.   Specialty: PALLIATIVE CARE Contact information: [address] [facility] [place] [phone]             Allergies  Allergen Reactions  . Losartan Rash    erythroderma  . Sulfa Antibiotics Itching and Rash    Consultations:  Cardiology    Procedures/Studies: DG Chest 2 View  Result Date: 05/20/2020 CLINICAL DATA:  Fatigue with lower extremity edema. EXAM: CHEST - 2 VIEW COMPARISON:  May 04, 2020 FINDINGS: There is underlying interstitial thickening, likely fibrotic change. There is no appreciable edema or consolidation. The heart is upper normal in size with pulmonary vascularity normal. Central peribronchial thickening noted. There is aortic atherosclerosis. No adenopathy. There are calcified breast implants  bilaterally. Bones appear osteoporotic. There are surgical clips in the right neck region. There is calcification in each axillary artery. IMPRESSION: Suspected underlying interstitial fibrosis. No edema or airspace opacity. Heart upper normal in size. No adenopathy appreciable. Calcified breast implants. Aortic Atherosclerosis (ICD10-I70.0). Axillary artery calcification bilaterally also noted. Bones appear osteoporotic. Electronically Signed   By: Bretta BangWilliam  Woodruff III M.D.   On: 05/20/2020 11:45   CT Head Wo Contrast  Result Date: 05/20/2020 CLINICAL DATA:  Delirium/altered mental status EXAM: CT HEAD WITHOUT CONTRAST TECHNIQUE: Contiguous axial images were obtained from the base of the skull through the vertex without intravenous contrast. COMPARISON:  None. FINDINGS: Brain: There is moderate diffuse atrophy. There is no intracranial mass, hemorrhage extra-axial fluid collection, or midline shift. There is patchy small vessel disease in the centra semiovale bilaterally. Small vessel disease is noted in the posterior limb of each internal capsule. No acute appearing infarct evident. Vascular: No hyperdense vessel. There is calcification in each carotid siphon and distal left vertebral artery region. Skull: The bony calvarium appears intact. Sinuses/Orbits: Visualized paranasal sinuses are clear. There is apparent cataract removal on the left. Orbits otherwise appear symmetric bilaterally. Other: Mastoid air cells are clear. IMPRESSION: Atrophy with patchy supratentorial small vessel disease. No acute infarct. No  mass or hemorrhage. There are foci of arterial vascular calcification. Electronically Signed   By: Bretta Bang III M.D.   On: 05/20/2020 11:42   CT ABDOMEN PELVIS W CONTRAST  Result Date: 05/20/2020 CLINICAL DATA:  Fatigue, leg swelling, difficulty eating and drinking the past 2 months. EXAM: CT ABDOMEN AND PELVIS WITH CONTRAST TECHNIQUE: Multidetector CT imaging of the abdomen and pelvis  was performed using the standard protocol following bolus administration of intravenous contrast. CONTRAST:  OMNIPAQUE IOHEXOL 300 MG/ML  SOLN COMPARISON:  Chest x-ray from same day. FINDINGS: Lower chest: Borderline cardiomegaly with enlarged right atrium. Small right greater than left pleural effusions. Centrilobular emphysema. Hepatobiliary: Subcentimeter low-density lesion in the right hepatic lobe is too small to characterize. No other focal liver abnormality. Somewhat contracted gallbladder with diffuse wall thickening. No gallstones or biliary dilatation. Pancreas: Unremarkable. No pancreatic ductal dilatation or surrounding inflammatory changes. Spleen: Normal in size without focal abnormality. Adrenals/Urinary Tract: 1.5 cm indeterminate lesion in the left adrenal gland. The right adrenal gland is unremarkable. No renal calculi, solid lesion, or hydronephrosis. The bladder is underdistended. Stomach/Bowel: Stomach is within normal limits. Appendix appears normal. No evidence of bowel wall thickening, distention, or inflammatory changes. Extensive colonic diverticulosis. Vascular/Lymphatic: Extensive aortoiliac atherosclerosis. 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 No enlarged abdominal or pelvic lymph nodes. Reproductive: Fibroid uterus. Some fibroids are calcified, others are cystically degenerated. No adnexal mass. Other: Small ascites.  No pneumoperitoneum. Musculoskeletal: No acute or significant osseous findings. Anasarca. IMPRESSION: 1. No acute intra-abdominal process. 2. Small right greater than left pleural effusions, small ascites, and anasarca, consistent with volume overload. 3. 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 4. Indeterminate 1.5 cm left adrenal lesion. Consider follow-up adrenal protocol CT with and without contrast in 12 months. This recommendation follows ACR consensus guidelines: Management of Incidental Adrenal Masses: A  White Paper of the ACR Incidental Findings Committee. J Am Coll Radiol 2017;14:1038-1044. 5. Aortic Atherosclerosis (ICD10-I70.0) and Emphysema (ICD10-J43.9). Electronically Signed   By: Obie Dredge M.D.   On: 05/20/2020 12:53   CARDIAC CATHETERIZATION  Result Date: 05/22/2020  Ost LM lesion is 80% stenosed.  Mid RCA lesion is 70% stenosed.  Prox RCA lesion is 30% stenosed.  Ost Cx lesion is 20% stenosed.  Prox Cx lesion is 50% stenosed.  1st Mrg lesion is 70% stenosed.  Mid LAD lesion is 60% stenosed.  Mid Cx lesion is 20% stenosed.  Severely calcified coronary arteries with 80% calcified ostial/proximal left main stenosis with pressure dampening; calcified LAD with 55 to 60% mid stenosis; 20 and 50% proximal circumflex stenoses with 70% AV groove stenosis; dominant RCA with 30% diffuse irregularity in the mid vessel with 70% stenosis proximal to the acute margin. Echo documentation of EF 20% with grade 3 restrictive physiology, biatrial enlargement, and moderate to severe MR and severe TR. RECOMMENDATION: Patient has severe cardiomyopathy.  Vessels are significantly calcified particularly at the ostium of the left main with at least ostial to proximal 80% left main stenosis.  The patient will be transported from the Cath Lab to 2H.  She may require inotropic therapy and advanced heart failure management and surgical evaluation.  Findings discussed with Dr. Janne Napoleon who is the consulting cardiologist.   ECHOCARDIOGRAM COMPLETE  Result Date: 05/21/2020    ECHOCARDIOGRAM REPORT   Patient Name:   AIDEEN FENSTER Date of Exam: 05/21/2020 Medical Rec #:  [id number]        Height:  58.0 in Accession #:    [id number]       Weight:       94.8 lb Date of Birth:  Aug 24, 1941        BSA:          1.326 m Patient Age:    79 years         BP:           128/73 mmHg Patient Gender: F                HR:           97 bpm. Exam Location:  Inpatient Procedure: 2D Echo, Cardiac Doppler and Color  Doppler Indications:    I50.23 Acute on chronic systolic (congestive) heart failure  History:        Patient has no prior history of Echocardiogram examinations.                 Risk Factors:Diabetes and Dyslipidemia.  Sonographer:    Tiffany Dance Referring Phys: [id number] CHING T TU IMPRESSIONS  1. Left ventricular ejection fraction, by estimation, is 20%. The left ventricle has severely decreased function. The left ventricle demonstrates global hypokinesis. Left ventricular diastolic parameters are consistent with Grade III diastolic dysfunction (restrictive).  2. Right ventricular systolic function is mildly reduced. The right ventricular size is mildly enlarged. There is moderately elevated pulmonary artery systolic pressure. The estimated right ventricular systolic pressure is 53.9 mmHg.  3. Left atrial size was moderately dilated.  4. Right atrial size was moderately dilated.  5. The mitral valve is degenerative. Moderate to severe mitral valve regurgitation. No evidence of mitral stenosis.  6. Tricuspid valve regurgitation is severe.  7. The aortic valve is tricuspid. Aortic valve regurgitation is not visualized. No aortic stenosis is present.  8. The inferior vena cava is dilated in size with <50% respiratory variability, suggesting right atrial pressure of 15 mmHg. FINDINGS  Left Ventricle: Left ventricular ejection fraction, by estimation, is 20%. The left ventricle has severely decreased function. The left ventricle demonstrates global hypokinesis. The left ventricular internal cavity size was normal in size. There is no left ventricular hypertrophy. Left ventricular diastolic parameters are consistent with Grade III diastolic dysfunction (restrictive). Right Ventricle: The right ventricular size is mildly enlarged. No increase in right ventricular wall thickness. Right ventricular systolic function is mildly reduced. There is moderately elevated pulmonary artery systolic pressure. The tricuspid  regurgitant velocity is 3.12 m/s, and with an assumed right atrial pressure of 15 mmHg, the estimated right ventricular systolic pressure is 53.9 mmHg. Left Atrium: Left atrial size was moderately dilated. Right Atrium: Right atrial size was moderately dilated. Pericardium: Trivial pericardial effusion is present. Mitral Valve: The mitral valve is degenerative in appearance. Moderate mitral annular calcification. Moderate to severe mitral valve regurgitation. No evidence of mitral valve stenosis. Tricuspid Valve: The tricuspid valve is grossly normal. Tricuspid valve regurgitation is severe. Aortic Valve: The aortic valve is tricuspid. Aortic valve regurgitation is not visualized. No aortic stenosis is present. Pulmonic Valve: The pulmonic valve was grossly normal. Pulmonic valve regurgitation is trivial. Aorta: The aortic root and ascending aorta are structurally normal, with no evidence of dilitation. Venous: The inferior vena cava is dilated in size with less than 50% respiratory variability, suggesting right atrial pressure of 15 mmHg. IAS/Shunts: No atrial level shunt detected by color flow Doppler.  LEFT VENTRICLE PLAX 2D LVIDd:         4.60 cm  Diastology LVIDs:  4.20 cm  LV e' lateral:   6.53 cm/s LV PW:         0.90 cm  LV E/e' lateral: 18.2 LV IVS:        0.90 cm LVOT diam:     2.10 cm LV SV:         24 LV SV Index:   18 LVOT Area:     3.46 cm  RIGHT VENTRICLE            IVC RV Basal diam:  3.20 cm    IVC diam: 2.60 cm RV Mid diam:    2.20 cm RV S prime:     7.94 cm/s TAPSE (M-mode): 1.3 cm LEFT ATRIUM             Index       RIGHT ATRIUM           Index LA diam:        3.90 cm 2.94 cm/m  RA Area:     18.80 cm LA Vol (A2C):   61.8 ml 46.60 ml/m RA Volume:   52.00 ml  39.21 ml/m LA Vol (A4C):   47.6 ml 35.89 ml/m LA Biplane Vol: 55.0 ml 41.47 ml/m  AORTIC VALVE LVOT Vmax:   45.40 cm/s LVOT Vmean:  30.700 cm/s LVOT VTI:    0.069 m  AORTA Ao Root diam: 2.80 cm Ao Asc diam:  2.95 cm MITRAL VALVE                 TRICUSPID VALVE MV Area (PHT): 5.38 cm     TR Peak grad:   38.9 mmHg MV Decel Time: 141 msec     TR Vmax:        312.00 cm/s MV E velocity: 119.00 cm/s MV A velocity: 59.60 cm/s   SHUNTS MV E/A ratio:  2.00         Systemic VTI:  0.07 m                             Systemic Diam: 2.10 cm Weston Brass MD Electronically signed by Weston Brass MD Signature Date/Time: 05/21/2020/7:35:21 PM    Final       Subjective: Denies chest pain   Discharge Exam: Vitals:   05/25/20 0800 05/25/20 0801  BP: 100/82   Pulse: 94 84  Resp: (!) 22   Temp:  98.4 F (36.9 C)  SpO2: 92% 93%     General: Pt is alert, awake, not in acute distress Cardiovascular: RRR, S1/S2 +, no rubs, no gallops Respiratory: CTA bilaterally, no wheezing, no rhonchi Abdominal: Soft, NT, ND, bowel sounds + Extremities: no edema, no cyanosis    The results of significant diagnostics from this hospitalization (including imaging, microbiology, ancillary and laboratory) are listed below for reference.     Microbiology: Recent Results (from the past 240 hour(s))  Blood culture (routine x 2)     Status: None   Collection Time: 05/20/20 10:36 AM   Specimen: BLOOD  Result Value Ref Range Status   Specimen Description   Final    BLOOD RIGHT ANTECUBITAL Performed at [facility], [address] [place], [place] [address]    Special Requests   Final    BOTTLES DRAWN AEROBIC AND ANAEROBIC Blood Culture adequate volume Performed at [facility], [address] [place], [place] [address]    Culture   Final    NO  GROWTH 5 DAYS Performed at [facility], [address], [place], [place] [address]    Report Status 05/25/2020 FINAL  Final  SARS Coronavirus 2 by RT PCR (hospital order, performed in [facility] hospital lab) Nasopharyngeal Nasopharyngeal Swab     Status: None   Collection Time: 05/20/20 11:16 AM   Specimen: Nasopharyngeal Swab  Result Value Ref Range Status   SARS  Coronavirus 2 NEGATIVE NEGATIVE Final    Comment: (NOTE) SARS-CoV-2 target nucleic acids are NOT DETECTED.  The SARS-CoV-2 RNA is generally detectable in upper and lower respiratory specimens during the acute phase of infection. The lowest concentration of SARS-CoV-2 viral copies this assay can detect is 250 copies / mL. A negative result does not preclude SARS-CoV-2 infection and should not be used as the sole basis for treatment or other patient management decisions.  A negative result may occur with improper specimen collection / handling, submission of specimen other than nasopharyngeal swab, presence of viral mutation(s) within the areas targeted by this assay, and inadequate number of viral copies (<250 copies / mL). A negative result must be combined with clinical observations, patient history, and epidemiological information.  Fact Sheet for Patients:   BoilerBrush.com.cy  Fact Sheet for Healthcare Providers: https://pope.com/  This test is not yet approved or  cleared by the [place] FDA and has been authorized for detection and/or diagnosis of SARS-CoV-2 by FDA under an Emergency Use Authorization (EUA).  This EUA will remain in effect (meaning this test can be used) for the duration of the COVID-19 declaration under Section 564(b)(1) of the Act, 21 U.S.C. section 360bbb-3(b)(1), unless the authorization is terminated or revoked sooner.  Performed at [facility], [address] [place], [place] [address]   Blood culture (routine x 2)     Status: None   Collection Time: 05/20/20 11:16 AM   Specimen: BLOOD RIGHT FOREARM  Result Value Ref Range Status   Specimen Description   Final    BLOOD RIGHT FOREARM Performed at [facility], [address] [place], [place] [address]    Special Requests   Final    BOTTLES DRAWN AEROBIC AND ANAEROBIC Blood Culture adequate volume Performed at [facility], [address] [place], [place] [address]    Culture   Final    NO GROWTH 5 DAYS Performed at [facility], [address], [place], [place] [address]    Report Status 05/25/2020 FINAL  Final  Urine culture     Status: Abnormal   Collection Time: 05/20/20  1:43 PM   Specimen: Urine, Clean Catch  Result Value Ref Range Status   Specimen Description   Final    URINE, CLEAN CATCH Performed at [facility], [address] [place], [place] [address]    Special Requests   Final    NONE Performed at [facility], [address] [place], [place] [address]    Culture MULTIPLE SPECIES PRESENT, SUGGEST RECOLLECTION (A)  Final   Report Status 05/21/2020 FINAL  Final  MRSA PCR Screening     Status: None   Collection Time: 05/22/20  8:30 PM   Specimen: Nasal Mucosa; Nasopharyngeal  Result Value Ref Range Status   MRSA by PCR NEGATIVE NEGATIVE Final    Comment:        The GeneXpert MRSA Assay (FDA approved for NASAL specimens only), is one component of a comprehensive MRSA  colonization surveillance program. It is not intended to diagnose MRSA infection nor to guide or monitor treatment for MRSA infections. Performed at [facility], [address] [place], [place] [address]      Labs: BNP (last 3 results) Recent Labs    05/20/20 1036  BNP 3,975.8*   Basic Metabolic Panel: Recent Labs  Lab 05/21/20 0240 05/21/20 0240 05/22/20 0307 05/22/20 0307 05/22/20 1417 05/22/20 1422 05/23/20 0026 05/24/20 0120 05/25/20 0055  NA 136   < > 131*   < > 136 137  136 136 134* 135  K 4.3   < > 3.4*   < > 3.5 3.5  3.5 3.6 3.2* 3.6  CL 101  --  98  --   --   --  97* 94* 95*  CO2 23  --  22  --   --   --  26 30 29   GLUCOSE 133*  --  255*  --   --   --  151* 94 139*  BUN 16  --  19  --   --   --  19 17 17   CREATININE 1.00  --  0.93  --   --   --  0.93 0.85 0.93  CALCIUM 8.3*  --  8.0*  --   --   --  8.4* 7.8* 7.8*  MG  --   --  1.1*   --   --   --  1.7  --   --    < > = values in this interval not displayed.   Liver Function Tests: Recent Labs  Lab 05/20/20 1036  AST 20  ALT 16  ALKPHOS 97  BILITOT 1.1  PROT 6.8  ALBUMIN 3.6   Recent Labs  Lab 05/20/20 1036  LIPASE 24   No results for input(s): AMMONIA in the last 168 hours. CBC: Recent Labs  Lab 05/20/20 1036 05/20/20 1036 05/21/20 0240 05/22/20 0307 05/22/20 1417 05/22/20 1422 05/23/20 0026  WBC 6.6  --  6.1 6.7  --   --  10.2  NEUTROABS 5.4  --   --   --   --   --   --   HGB 11.1*   < > 9.8* 9.7* 11.2* 11.2*  11.2* 10.5*  HCT 37.7   < > 33.5* 32.0* 33.0* 33.0*  33.0* 34.3*  MCV 81.1  --  80.9 79.6*  --   --  79.8*  PLT 367  --  324 308  --   --  313   < > = values in this interval not displayed.   Cardiac Enzymes: No results for input(s): CKTOTAL, CKMB, CKMBINDEX, TROPONINI in the last 168 hours. BNP: Invalid input(s): POCBNP CBG: Recent Labs  Lab 05/24/20 0626 05/24/20 1135 05/24/20 1542 05/24/20 2115 05/25/20 0652  GLUCAP 96 295* 182* 213* 170*   D-Dimer No results for input(s): DDIMER in the last 72 hours. Hgb A1c No results for input(s): HGBA1C in the last 72 hours. Lipid Profile No results for input(s): CHOL, HDL, LDLCALC, TRIG, CHOLHDL, LDLDIRECT in the last 72 hours. Thyroid function studies No results for input(s): TSH, T4TOTAL, T3FREE, THYROIDAB in the last 72 hours.  Invalid input(s): FREET3 Anemia work up No results for input(s): VITAMINB12, FOLATE, FERRITIN, TIBC, IRON, RETICCTPCT in the last 72 hours. Urinalysis    Component Value Date/Time   COLORURINE YELLOW 05/20/2020 1345   APPEARANCEUR HAZY (A) 05/20/2020 1345   LABSPEC 1.020 05/20/2020 1345   PHURINE 5.5 05/20/2020 1345  GLUCOSEU NEGATIVE 05/20/2020 1345   HGBUR NEGATIVE 05/20/2020 1345   BILIRUBINUR NEGATIVE 05/20/2020 1345   KETONESUR NEGATIVE 05/20/2020 1345   PROTEINUR 100 (A) 05/20/2020 1345   NITRITE NEGATIVE 05/20/2020 1345   LEUKOCYTESUR  NEGATIVE 05/20/2020 1345   Sepsis Labs Invalid input(s): PROCALCITONIN,  WBC,  LACTICIDVEN Microbiology Recent Results (from the past 240 hour(s))  Blood culture (routine x 2)     Status: None   Collection Time: 05/20/20 10:36 AM   Specimen: BLOOD  Result Value Ref Range Status   Specimen Description   Final    BLOOD RIGHT ANTECUBITAL Performed at [facility], [address] [place], [place] [address]    Special Requests   Final    BOTTLES DRAWN AEROBIC AND ANAEROBIC Blood Culture adequate volume Performed at [facility], [address] [place], [place] [address]    Culture   Final    NO GROWTH 5 DAYS Performed at [facility], [address], [place], [place] [address]    Report Status 05/25/2020 FINAL  Final  SARS Coronavirus 2 by RT PCR (hospital order, performed in [facility] hospital lab) Nasopharyngeal Nasopharyngeal Swab     Status: None   Collection Time: 05/20/20 11:16 AM   Specimen: Nasopharyngeal Swab  Result Value Ref Range Status   SARS Coronavirus 2 NEGATIVE NEGATIVE Final    Comment: (NOTE) SARS-CoV-2 target nucleic acids are NOT DETECTED.  The SARS-CoV-2 RNA is generally detectable in upper and lower respiratory specimens during the acute phase of infection. The lowest concentration of SARS-CoV-2 viral copies this assay can detect is 250 copies / mL. A negative result does not preclude SARS-CoV-2 infection and should not be used as the sole basis for treatment or other patient management decisions.  A negative result may occur with improper specimen collection / handling, submission of specimen other than nasopharyngeal swab, presence of viral mutation(s) within the areas targeted by this assay, and inadequate number of viral copies (<250 copies / mL). A negative result must be combined with clinical observations, patient history, and epidemiological information.  Fact Sheet for Patients:    BoilerBrush.com.cy  Fact Sheet for Healthcare Providers: https://pope.com/  This test is not yet approved or  cleared by the [place] FDA and has been authorized for detection and/or diagnosis of SARS-CoV-2 by FDA under an Emergency Use Authorization (EUA).  This EUA will remain in effect (meaning this test can be used) for the duration of the COVID-19 declaration under Section 564(b)(1) of the Act, 21 U.S.C. section 360bbb-3(b)(1), unless the authorization is terminated or revoked sooner.  Performed at [facility], [address] [place], [place] [address]   Blood culture (routine x 2)     Status: None   Collection Time: 05/20/20 11:16 AM   Specimen: BLOOD RIGHT FOREARM  Result Value Ref Range Status   Specimen Description   Final    BLOOD RIGHT FOREARM Performed at [facility], [address] [place], [place] [address]    Special Requests   Final    BOTTLES DRAWN AEROBIC AND ANAEROBIC Blood Culture adequate volume Performed at [facility], [address] [place], [place] [address]    Culture   Final    NO GROWTH 5 DAYS Performed at [facility], [address] [place], [place] [address]    Report Status 05/25/2020 FINAL  Final  Urine culture  Status: Abnormal   Collection Time: 05/20/20  1:43 PM   Specimen: Urine, Clean Catch  Result Value Ref Range Status   Specimen Description   Final    URINE, CLEAN CATCH Performed at [facility], [address] [place], [place] [address]    Special Requests   Final    NONE Performed at [facility], [address] [place], [place] [address]    Culture MULTIPLE SPECIES PRESENT, SUGGEST RECOLLECTION (A)  Final   Report Status 05/21/2020 FINAL  Final  MRSA PCR Screening     Status: None   Collection Time: 05/22/20  8:30 PM   Specimen: Nasal Mucosa; Nasopharyngeal  Result Value Ref Range Status   MRSA by  PCR NEGATIVE NEGATIVE Final    Comment:        The GeneXpert MRSA Assay (FDA approved for NASAL specimens only), is one component of a comprehensive MRSA colonization surveillance program. It is not intended to diagnose MRSA infection nor to guide or monitor treatment for MRSA infections. Performed at [facility], [address], [place], [place] [address]      Time coordinating discharge: 40 minutes  SIGNED:   Alba Cory, MD  [facility]

## 2020-09-26 DEATH — deceased

## 2021-03-07 IMAGING — CT CT ABD-PELV W/ CM
2 of 5 series · 15 of 46 positions shown, 17 images · IV contrast (Omnipaque)
Comparison: Chest x-ray from same day.

CLINICAL DATA: Fatigue, leg swelling, difficulty eating and
drinking the past 2 months.

EXAM:
CT ABDOMEN AND PELVIS WITH CONTRAST
TECHNIQUE: Multidetector CT imaging of the abdomen and pelvis was performed
using the standard protocol following bolus administration of
intravenous contrast.
CONTRAST:  100mL OMNIPAQUE IOHEXOL 300 MG/ML  SOLN

[Series 2: axial st · axial · 0.73mm/px · z∈[-420,-50]mm · 12 of 84 slices shown, 14 images]
[im 5/84  soft-tissue]
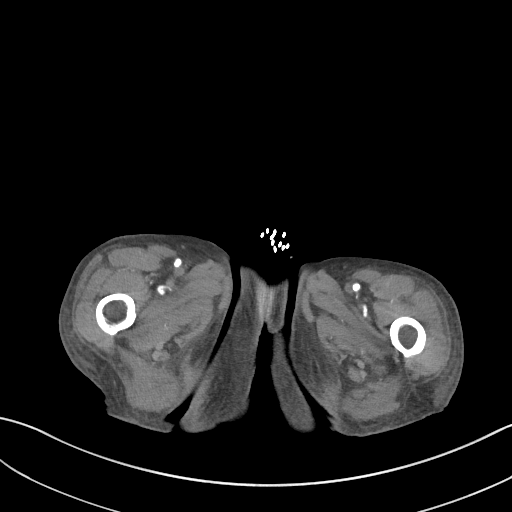
[im 5/84  bone]
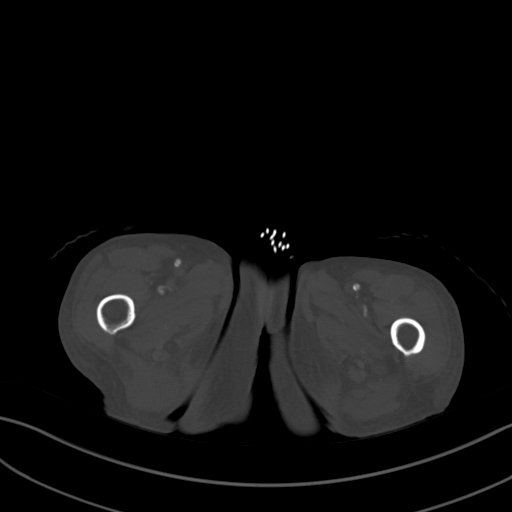
[im 15/84  soft-tissue]
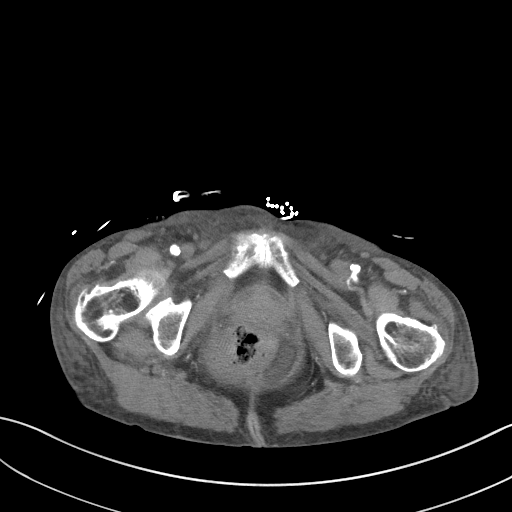
[im 20/84  soft-tissue]
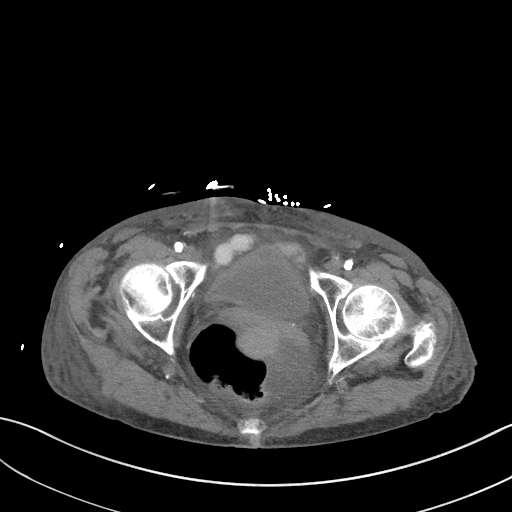
[im 25/84  soft-tissue]
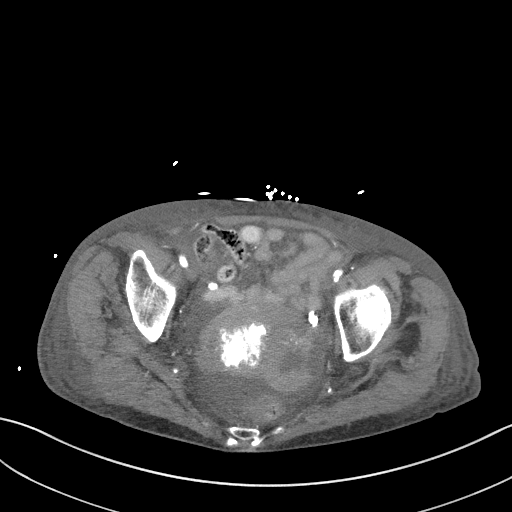
[im 35/84  soft-tissue]
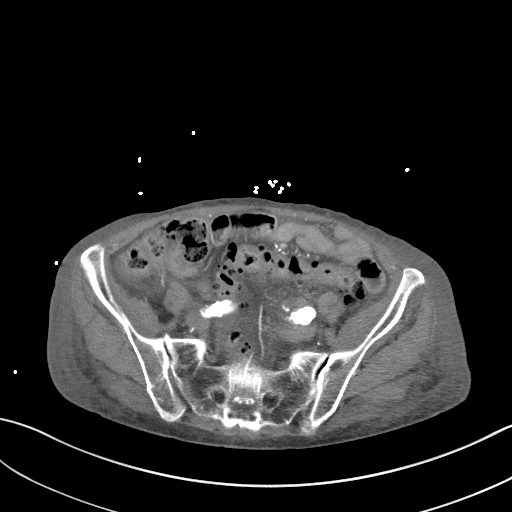
[im 40/84  soft-tissue]
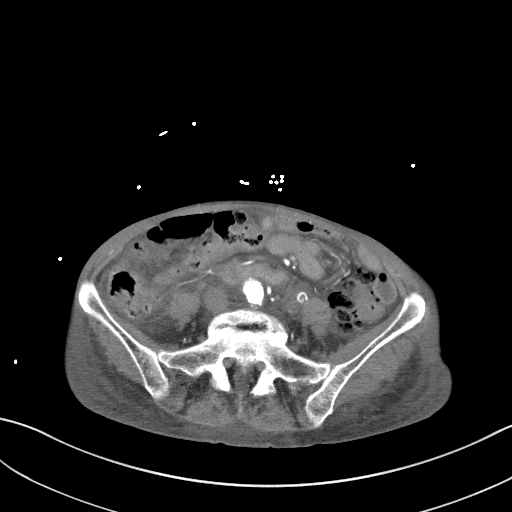
[im 44/84  soft-tissue]
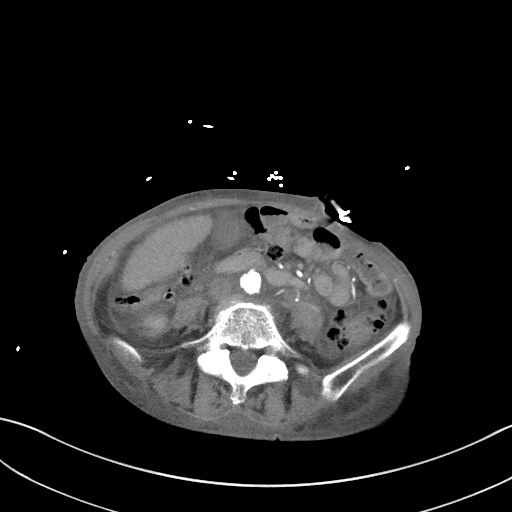
[im 54/84  soft-tissue]
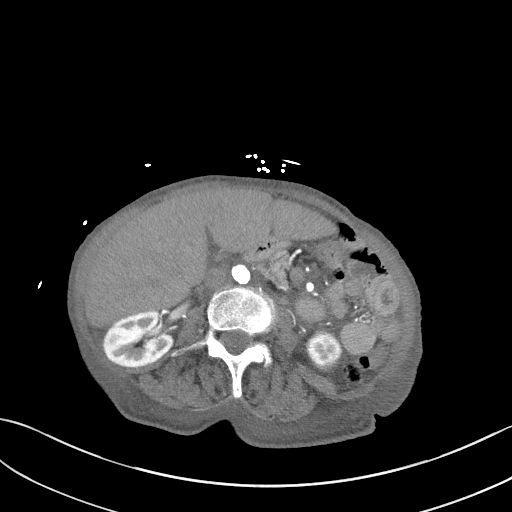
[im 59/84  soft-tissue]
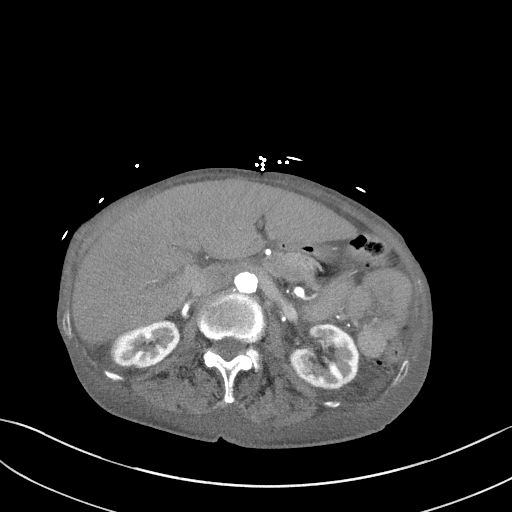
[im 59/84  bone]
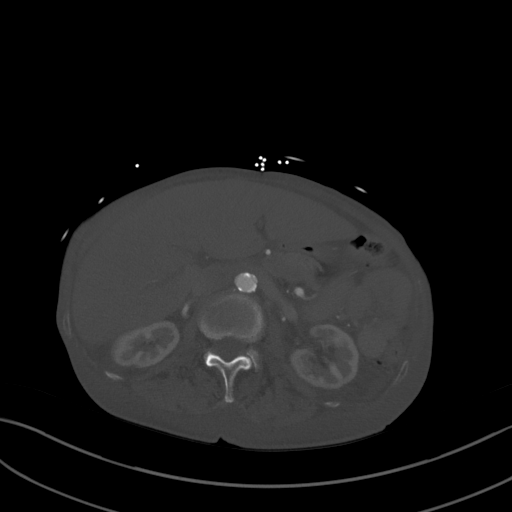
[im 64/84  soft-tissue]
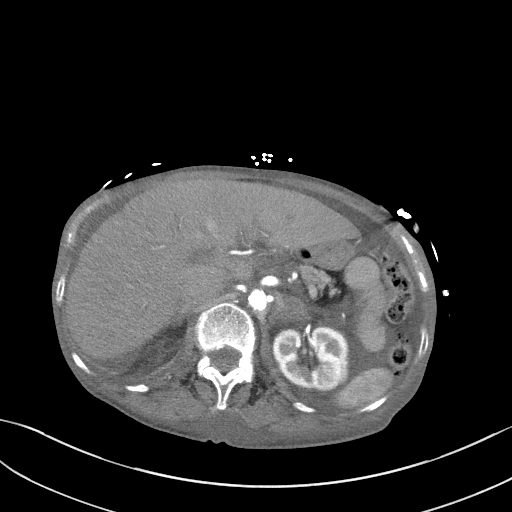
[im 74/84  soft-tissue]
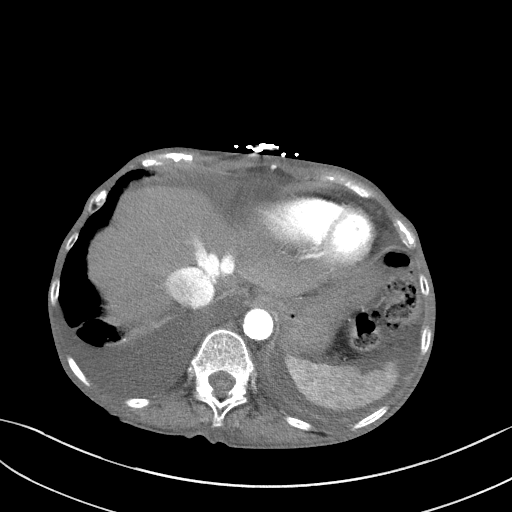
[im 79/84  soft-tissue]
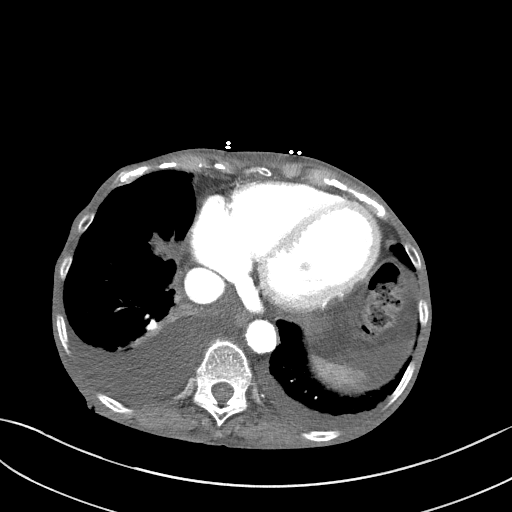

[Series 5: coronal st · coronal · 0.57mm/px · 3 of 75 slices shown]
[im 25/75  soft-tissue]
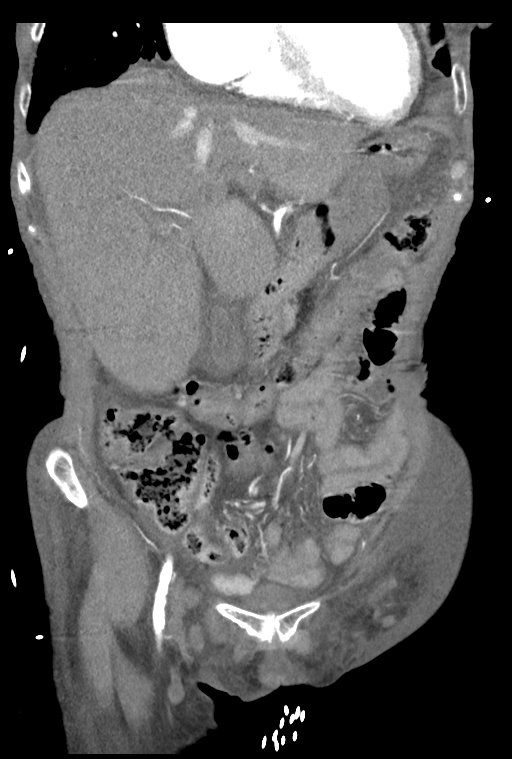
[im 33/75  soft-tissue]
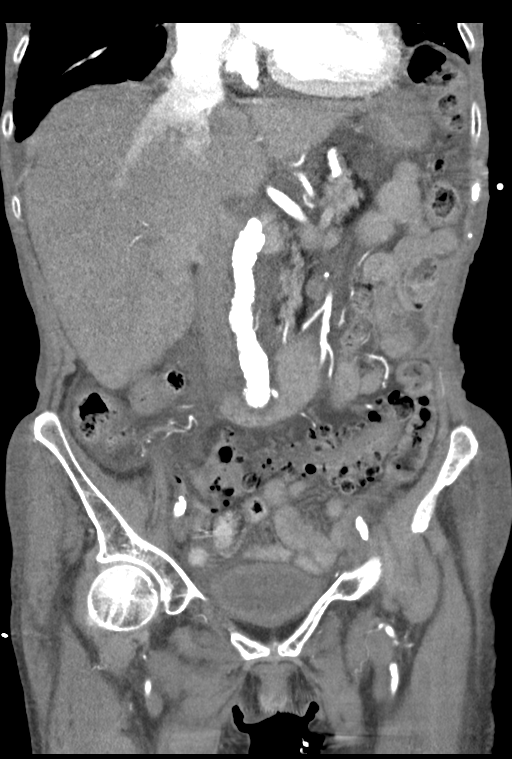
[im 42/75  soft-tissue]
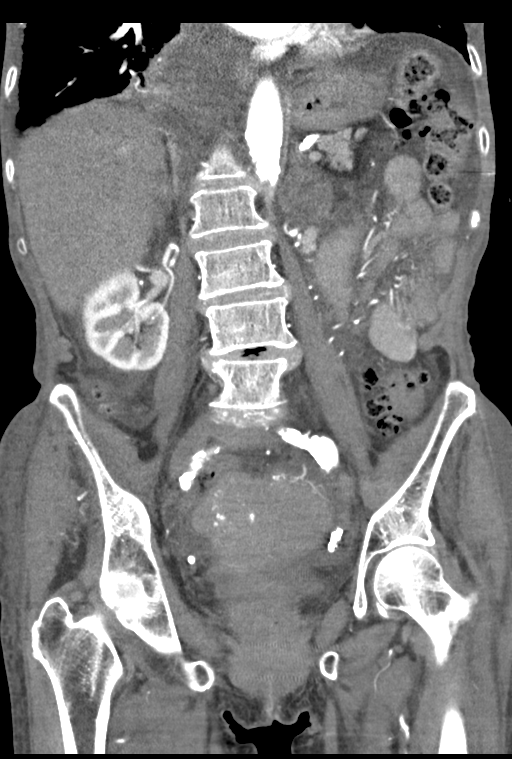

[15 of 46 positions shown; findings below may reference images not displayed]

FINDINGS: Lower chest: Borderline cardiomegaly with enlarged right atrium.
Small right greater than left pleural effusions. Centrilobular
emphysema.

Hepatobiliary: Subcentimeter low-density lesion in the right hepatic
lobe is too small to characterize. No other focal liver abnormality.
Somewhat contracted gallbladder with diffuse wall thickening. No
gallstones or biliary dilatation.

Pancreas: Unremarkable. No pancreatic ductal dilatation or
surrounding inflammatory changes.

Spleen: Normal in size without focal abnormality.

Adrenals/Urinary Tract: 1.5 cm indeterminate lesion in the left
adrenal gland. The right adrenal gland is unremarkable. No renal
calculi, solid lesion, or hydronephrosis. The bladder is
underdistended.

Stomach/Bowel: Stomach is within normal limits. Appendix appears
normal. No evidence of bowel wall thickening, distention, or
inflammatory changes. Extensive colonic diverticulosis.

Vascular/Lymphatic: Extensive aortoiliac atherosclerosis. Severe
stenosis of the proximal SMA due to atherosclerotic plaque. No
enlarged abdominal or pelvic lymph nodes.

Reproductive: Fibroid uterus. Some fibroids are calcified, others
are cystically degenerated. No adnexal mass.

Other: Small ascites.  No pneumoperitoneum.

Musculoskeletal: No acute or significant osseous findings. Anasarca.
IMPRESSION: 1. No acute intra-abdominal process.
2. Small right greater than left pleural effusions, small ascites,
and anasarca, consistent with volume overload.
3. Severe stenosis of the proximal SMA due to atherosclerotic
plaque.
4. Indeterminate 1.5 cm left adrenal lesion. Consider follow-up
adrenal protocol CT with and without contrast in 12 months. This
recommendation follows ACR consensus guidelines: Management of
Incidental Adrenal Masses: A White Paper of the ACR Incidental
Findings Committee. [HOSPITAL] 5170;14:[id number].
5. Aortic Atherosclerosis (S3DFX-KJM.M) and Emphysema (S3DFX-AQ5.5).
